# Patient Record
Sex: Male | Born: 1970 | Race: White | Hispanic: No | Smoking: Never smoker
Health system: Southern US, Community
[De-identification: ages and names within clinical notes are randomized; demographics above are authoritative.]

## PROBLEM LIST (undated history)

## (undated) DIAGNOSIS — B0229 Other postherpetic nervous system involvement: Secondary | ICD-10-CM

---

## 2016-06-23 ENCOUNTER — Emergency Department (HOSPITAL_BASED_OUTPATIENT_CLINIC_OR_DEPARTMENT_OTHER): Payer: Self-pay

## 2016-06-23 ENCOUNTER — Encounter (HOSPITAL_BASED_OUTPATIENT_CLINIC_OR_DEPARTMENT_OTHER): Payer: Self-pay | Admitting: *Deleted

## 2016-06-23 ENCOUNTER — Emergency Department (HOSPITAL_BASED_OUTPATIENT_CLINIC_OR_DEPARTMENT_OTHER)
Admission: EM | Admit: 2016-06-23 | Discharge: 2016-06-24 | Disposition: A | Discharge status: Home / Self Care | Payer: Self-pay | Attending: Emergency Medicine | Admitting: Emergency Medicine

## 2016-06-23 DIAGNOSIS — R229 Localized swelling, mass and lump, unspecified: Secondary | ICD-10-CM

## 2016-06-23 DIAGNOSIS — IMO0002 Reserved for concepts with insufficient information to code with codable children: Secondary | ICD-10-CM

## 2016-06-23 DIAGNOSIS — N492 Inflammatory disorders of scrotum: Secondary | ICD-10-CM | POA: Insufficient documentation

## 2016-06-23 DIAGNOSIS — R079 Chest pain, unspecified: Secondary | ICD-10-CM

## 2016-06-23 DIAGNOSIS — R072 Precordial pain: Secondary | ICD-10-CM | POA: Insufficient documentation

## 2016-06-23 DIAGNOSIS — R0602 Shortness of breath: Secondary | ICD-10-CM | POA: Insufficient documentation

## 2016-06-23 LAB — URINALYSIS, ROUTINE W REFLEX MICROSCOPIC
BILIRUBIN URINE: NEGATIVE
Glucose, UA: NEGATIVE mg/dL
HGB URINE DIPSTICK: NEGATIVE
Ketones, ur: NEGATIVE mg/dL
Leukocytes, UA: NEGATIVE
NITRITE: NEGATIVE
PROTEIN: NEGATIVE mg/dL
Specific Gravity, Urine: 1.024 (ref 1.005–1.030)
pH: 6.5 (ref 5.0–8.0)

## 2016-06-23 LAB — COMPREHENSIVE METABOLIC PANEL
ALT: 15 U/L — ABNORMAL LOW (ref 17–63)
AST: 20 U/L (ref 15–41)
Albumin: 3.9 g/dL (ref 3.5–5.0)
Alkaline Phosphatase: 52 U/L (ref 38–126)
Anion gap: 9 (ref 5–15)
BILIRUBIN TOTAL: 0.3 mg/dL (ref 0.3–1.2)
BUN: 16 mg/dL (ref 6–20)
CO2: 24 mmol/L (ref 22–32)
CREATININE: 1.06 mg/dL (ref 0.61–1.24)
Calcium: 9.3 mg/dL (ref 8.9–10.3)
Chloride: 105 mmol/L (ref 101–111)
Glucose, Bld: 112 mg/dL — ABNORMAL HIGH (ref 65–99)
POTASSIUM: 3.8 mmol/L (ref 3.5–5.1)
Sodium: 138 mmol/L (ref 135–145)
TOTAL PROTEIN: 7 g/dL (ref 6.5–8.1)

## 2016-06-23 LAB — CBC WITH DIFFERENTIAL/PLATELET
Basophils Absolute: 0 10*3/uL (ref 0.0–0.1)
Basophils Relative: 0 %
EOS ABS: 0.1 10*3/uL (ref 0.0–0.7)
EOS PCT: 2 %
HCT: 41.9 % (ref 39.0–52.0)
Hemoglobin: 14.2 g/dL (ref 13.0–17.0)
LYMPHS ABS: 2 10*3/uL (ref 0.7–4.0)
Lymphocytes Relative: 22 %
MCH: 31.8 pg (ref 26.0–34.0)
MCHC: 33.9 g/dL (ref 30.0–36.0)
MCV: 93.9 fL (ref 78.0–100.0)
MONOS PCT: 10 %
Monocytes Absolute: 0.9 10*3/uL (ref 0.1–1.0)
Neutro Abs: 6.3 10*3/uL (ref 1.7–7.7)
Neutrophils Relative %: 66 %
PLATELETS: 235 10*3/uL (ref 150–400)
RBC: 4.46 MIL/uL (ref 4.22–5.81)
RDW: 14.1 % (ref 11.5–15.5)
WBC: 9.4 10*3/uL (ref 4.0–10.5)

## 2016-06-23 LAB — TROPONIN I: Troponin I: <0.03 ng/mL (ref <0.03)

## 2016-06-23 MED ORDER — DOXYCYCLINE HYCLATE 100 MG PO CAPS: 100.0000 mg | ORAL_CAPSULE | Freq: Two times a day (BID) | ORAL | 0 refills | Status: DC

## 2016-06-23 MED ORDER — DOXYCYCLINE HYCLATE 100 MG PO TABS
100.0000 mg | ORAL_TABLET | Freq: Once | ORAL | Status: AC
  Administered 2016-06-23: 100 mg via ORAL
  Filled 2016-06-23: qty 1

## 2016-06-23 MED ORDER — GI COCKTAIL ~~LOC~~
30.0000 mL | Freq: Once | ORAL | Status: AC
  Administered 2016-06-23: 30 mL via ORAL
  Filled 2016-06-23: qty 30

## 2016-06-23 NOTE — ED Notes (Signed)
Pt is now complaining of scrotum pain

## 2016-06-23 NOTE — Discharge Instructions (Signed)
1. IT IS IMPORTANT TO TAKE ANTIBIOTICS AS DIRECTED AND SEE A UROLOGIST IN 1-2 WEEKS, OR SOONER IF SYMPTOMS OF SCROTAL SWELLING/PAIN WORSEN.  2. SEEK IMMEDIATE MEDICAL ATTENTION IF ANY WORSENING CHEST PAIN/SHORTNESS OF BREATH. FOLLOW UP WITH A PRIMARY CARE PROVIDER AS SOON AS POSSIBLE FOR BLOOD PRESSURE MANAGEMENT.

## 2016-06-23 NOTE — ED Notes (Signed)
Pt and corrections officers verbalizes understanding of dc instructions and deny any further needs at this time.

## 2016-06-23 NOTE — ED Notes (Signed)
Pt states having mid sternal cp x 2-3 hours radiating to back  Denies other complaints  Pt has 2 correctional officers with him   Is being [place]  Started having pain en route

## 2016-06-23 NOTE — ED Provider Notes (Signed)
[facility] Note   CSN: [id number] Arrival date & time: 06/23/16  2005  By signing my name below, I, Sonum Patel, attest that this documentation has been prepared under the direction and in the presence of Laurence Spates, MD. Electronically Signed: Sonum Patel, Neurosurgeon. 06/23/16. 9:42 PM.  History   Chief Complaint Chief Complaint  Patient presents with  . Chest Pain   The history is provided by the patient. No language interpreter was used.    HPI Comments: Cody Parks is a 46 y.o. male who presents to the Emergency Department complaining of gradual onset, constant, gradually worsened substernal CP and mid back pain between the shoulder blades that started today. He reports associated SOB, abdominal pain, and right scrotal swelling and pain. He states the last time he had scrotal swelling was 1 week ago. He describes his abdominal pain feeling as though he was kicked in the groin. He denies any trauma to the abdomen or groin. Per chart review, he was seen on 4/5 for right scrotal swelling and pain and was diagnosed with epididymitis and was treated with 9 days of antibiotics and instructed to follow up with urology. He had surgery earlier this year for a scrotal abscess. He denies history of similar mid back pain in the past. He denies dysuria, diarrhea, constipation, fever. He denies history of chronic medical conditions. He denies history or CA or blood clots. He denies recent surgeries or hospitalizations. He denies any cocaine or tobacco use. He denies history of HTN or DM.  Of note, pt developed chest pain while being transported from one prison facility to another and was brought here during transport.  History reviewed. No pertinent past medical history.  There are no active problems to display for this patient.   History reviewed. No pertinent surgical history.     Home Medications    Prior to Admission medications   Medication Sig Start Date End  Date Taking? Authorizing Provider  doxycycline (VIBRAMYCIN) 100 MG capsule Take 1 capsule (100 mg total) by mouth 2 (two) times daily. 06/23/16   Laurence Spates, MD    Family History No family history on file.  Social History Social History  Substance Use Topics  . Smoking status: Never Smoker  . Smokeless tobacco: Never Used  . Alcohol use Yes     Allergies   Shellfish allergy   Review of Systems Review of Systems  Constitutional: Negative for fever.  Respiratory: Positive for shortness of breath.   Gastrointestinal: Positive for abdominal pain. Negative for constipation and diarrhea.  Genitourinary: Positive for scrotal swelling and testicular pain. Negative for dysuria.  Musculoskeletal: Positive for back pain.  All other systems reviewed and are negative.    Physical Exam Updated Vital Signs BP (!) 164/115   Pulse (!) 57   Temp 98.4 F (36.9 C) (Oral)   Resp 14   Ht  (1.753 m)   Wt 200 lb (90.7 kg)   SpO2 100%   BMI 29.53 kg/m   Physical Exam  Constitutional: He is oriented to person, place, and time. He appears well-developed and well-nourished. No distress.  HENT:  Head: Normocephalic and atraumatic.  Moist mucous membranes  Eyes: Conjunctivae are normal. Pupils are equal, round, and reactive to light.  Neck: Neck supple.  Cardiovascular: Normal rate, regular rhythm and normal heart sounds.   No murmur heard. Right arm: 160/108  Left arm: 160/106  Pulmonary/Chest: Effort normal and breath sounds normal. He exhibits tenderness (central  lower chest at xiphoid process).  Abdominal: Soft. Bowel sounds are normal. He exhibits no distension. There is no tenderness.  Genitourinary: Penis normal.  Genitourinary Comments: Right testicle larger than left with generalized tenderness with no skin changes in scrotum.   Musculoskeletal: He exhibits tenderness. He exhibits no edema.  Tenderness of mid thoracic back without step off or skin changes     Neurological: He is alert and oriented to person, place, and time.  Fluent speech  Skin: Skin is warm and dry.  Psychiatric:  Mildly anxious. Bizarre affect and avoids eye contact   Nursing note and vitals reviewed.    ED Treatments / Results  DIAGNOSTIC STUDIES: Oxygen Saturation is 96% on RA, adequate by my interpretation.    COORDINATION OF CARE: 9:24 PM Discussed treatment plan with pt at bedside and pt agreed to plan.   Labs (all labs ordered are listed, but only abnormal results are displayed) Labs Reviewed  COMPREHENSIVE METABOLIC PANEL - Abnormal; Notable for the following:       Result Value   Glucose, Bld 112 (*)    ALT 15 (*)    All other components within normal limits  URINE CULTURE  CBC WITH DIFFERENTIAL/PLATELET  TROPONIN I  URINALYSIS, ROUTINE W REFLEX MICROSCOPIC    EKG  EKG Interpretation  Date/Time:  Friday June 23 2016 20:08:44 EDT Ventricular Rate:  78 PR Interval:    QRS Duration: 146 QT Interval:  382 QTC Calculation: 436 R Axis:   76 Text Interpretation:  Sinus rhythm Nonspecific intraventricular conduction delay Baseline wander in lead(s) V1 needs repeat poor quality Confirmed by Dawana Asper MD, Amillion Macchia [phone]) on 06/23/2016 9:24:36 PM       Radiology US Scrotum  Result Date: 06/23/2016 CLINICAL DATA:  Acute onset of right testicular pain and swelling. Initial encounter. EXAM: SCROTAL ULTRASOUND DOPPLER ULTRASOUND OF THE TESTICLES TECHNIQUE: Complete ultrasound examination of the testicles, epididymis, and other scrotal structures was performed. Color and spectral Doppler ultrasound were also utilized to evaluate blood flow to the testicles. COMPARISON:  None. FINDINGS: Right testicle Measurements: 3.5 x 2.5 x 3.2 cm. No mass or microlithiasis visualized. Left testicle Measurements: 3.6 x 1.9 x 2.6 cm. No mass or microlithiasis visualized. Right epididymis:  Not definitely seen. Left epididymis: A tiny 0.3 cm cyst is noted at the left epididymal  head. Hydrocele:  None visualized. Varicocele:  None visualized. Pulsed Doppler interrogation of both testes demonstrates normal low resistance arterial and venous waveforms bilaterally. Diffuse skin thickening is noted at the right side of the scrotum, with slightly increased blood flow at the scrotal wall, concerning for hyperemia. IMPRESSION: 1. Diffuse skin thickening at the right side of the scrotum, with associated hyperemia, concerning for cellulitis. 2. No evidence of testicular torsion. 3. Tiny 0.3 cm left epididymal head cyst incidentally noted. Electronically Signed   By: Roanna Raider M.D.   On: 06/23/2016 22:24   [place] Art/ven Flow Abd Pelv Doppler  Result Date: 06/23/2016 CLINICAL DATA:  Acute onset of right testicular pain and swelling. Initial encounter. EXAM: SCROTAL ULTRASOUND DOPPLER ULTRASOUND OF THE TESTICLES TECHNIQUE: Complete ultrasound examination of the testicles, epididymis, and other scrotal structures was performed. Color and spectral Doppler ultrasound were also utilized to evaluate blood flow to the testicles. COMPARISON:  None. FINDINGS: Right testicle Measurements: 3.5 x 2.5 x 3.2 cm. No mass or microlithiasis visualized. Left testicle Measurements: 3.6 x 1.9 x 2.6 cm. No mass or microlithiasis visualized. Right epididymis:  Not definitely seen. Left epididymis: A tiny  0.3 cm cyst is noted at the left epididymal head. Hydrocele:  None visualized. Varicocele:  None visualized. Pulsed Doppler interrogation of both testes demonstrates normal low resistance arterial and venous waveforms bilaterally. Diffuse skin thickening is noted at the right side of the scrotum, with slightly increased blood flow at the scrotal wall, concerning for hyperemia. IMPRESSION: 1. Diffuse skin thickening at the right side of the scrotum, with associated hyperemia, concerning for cellulitis. 2. No evidence of testicular torsion. 3. Tiny 0.3 cm left epididymal head cyst incidentally noted. Electronically  Signed   By: Roanna Raider M.D.   On: 06/23/2016 22:24   Dg Chest Port 1 View  Result Date: 06/23/2016 CLINICAL DATA:  Mid sternal chest pain radiating to the back EXAM: PORTABLE CHEST 1 VIEW COMPARISON:  None. FINDINGS: The heart size and mediastinal contours are within normal limits. Both lungs are clear. The visualized skeletal structures are unremarkable. IMPRESSION: No active disease. Electronically Signed   By: Jasmine Pang M.D.   On: 06/23/2016 20:52    Procedures Procedures (including critical care time)  Medications Ordered in ED Medications  gi cocktail (Maalox,Lidocaine,Donnatal) (30 mLs Oral Given 06/23/16 2037)  doxycycline (VIBRA-TABS) tablet 100 mg (100 mg Oral Given 06/23/16 2339)     Initial Impression / Assessment and Plan / ED Course  I have reviewed the triage vital signs and the nursing notes.  Pertinent labs & imaging results that were available during my care of the patient were reviewed by me and considered in my medical decision making (see chart for details).     I personally performed the services described in this documentation, which was scribed in my presence. The recorded information has been reviewed and is accurate.  Pt p/w chest pain that was gradual in onset, associated upper back pain and SOB. He was Nontoxic on exam, vital signs notable for hypertension. Patient is not currently on any antihypertensive medications. EKG without acute ischemic changes. He did have point tenderness at his xiphoid process and mid thoracic back which he states reproduced his respective chest and back pain. This makes aortic dissection or ACS less likely. Upper extremity blood pressures equal, chest x-ray negative acute, and troponin negative. Other labs reassuring. He should incidentally noted his scrotal pain and swelling therefore obtained ultrasound which is suggestive of cellulitis but no fluid collection. I discussed with urologist on call, Dr. Butch Penny, appreciate  assistance. He recommended 2 weeks of doxycycline which I have provided and I have emphasized the importance of following up with urology within 1 week. Regarding his chest pain, the patient has no risk factors for PE given short journey and HEART score is <3; furthermore, sx are very atypical for ACS based on exam. I have discussed supportive care and reviewed return precautions. Patient discharged in satisfactory condition.  Final Clinical Impressions(s) / ED Diagnoses   Final diagnoses:  Mass  Chest pain, unspecified type  Cellulitis of scrotum    New Prescriptions Discharge Medication List as of 06/23/2016 11:57 PM    START taking these medications   Details  doxycycline (VIBRAMYCIN) 100 MG capsule Take 1 capsule (100 mg total) by mouth 2 (two) times daily., Starting Fri 06/23/2016, Print       I personally performed the services described in this documentation, which was scribed in my presence. The recorded information has been reviewed and is accurate.    Laurence Spates, MD 06/24/16 [phone]

## 2016-06-23 NOTE — ED Triage Notes (Signed)
He was being transported from [employer] to another facility with 2 police offices when pt c.o pain in the center of his chest after eating boiled eggs. Feels like indigestion.

## 2016-06-25 LAB — URINE CULTURE: Culture: NO GROWTH

## 2016-08-04 ENCOUNTER — Emergency Department (HOSPITAL_COMMUNITY)

## 2016-08-04 ENCOUNTER — Emergency Department
Admission: EM | Admit: 2016-08-04 | Discharge: 2016-08-04 | Attending: Emergency Medicine | Admitting: Emergency Medicine

## 2016-08-04 ENCOUNTER — Inpatient Hospital Stay (HOSPITAL_COMMUNITY)
Admission: EM | Admit: 2016-08-04 | Discharge: 2016-08-08 | DRG: 880 | Attending: Student in an Organized Health Care Education/Training Program | Admitting: Student in an Organized Health Care Education/Training Program

## 2016-08-04 ENCOUNTER — Encounter (HOSPITAL_COMMUNITY): Payer: Self-pay | Admitting: *Deleted

## 2016-08-04 ENCOUNTER — Emergency Department

## 2016-08-04 ENCOUNTER — Encounter: Payer: Self-pay | Admitting: Emergency Medicine

## 2016-08-04 DIAGNOSIS — I10 Essential (primary) hypertension: Secondary | ICD-10-CM | POA: Diagnosis not present

## 2016-08-04 DIAGNOSIS — R471 Dysarthria and anarthria: Secondary | ICD-10-CM | POA: Diagnosis present

## 2016-08-04 DIAGNOSIS — E669 Obesity, unspecified: Secondary | ICD-10-CM | POA: Diagnosis not present

## 2016-08-04 DIAGNOSIS — R29810 Facial weakness: Secondary | ICD-10-CM | POA: Diagnosis present

## 2016-08-04 DIAGNOSIS — R112 Nausea with vomiting, unspecified: Secondary | ICD-10-CM | POA: Diagnosis not present

## 2016-08-04 DIAGNOSIS — B0229 Other postherpetic nervous system involvement: Secondary | ICD-10-CM | POA: Diagnosis present

## 2016-08-04 DIAGNOSIS — Z79899 Other long term (current) drug therapy: Secondary | ICD-10-CM | POA: Diagnosis not present

## 2016-08-04 DIAGNOSIS — M5481 Occipital neuralgia: Secondary | ICD-10-CM | POA: Diagnosis present

## 2016-08-04 DIAGNOSIS — F449 Dissociative and conversion disorder, unspecified: Secondary | ICD-10-CM | POA: Diagnosis present

## 2016-08-04 DIAGNOSIS — R1084 Generalized abdominal pain: Secondary | ICD-10-CM | POA: Diagnosis not present

## 2016-08-04 DIAGNOSIS — Z91013 Allergy to seafood: Secondary | ICD-10-CM

## 2016-08-04 DIAGNOSIS — E785 Hyperlipidemia, unspecified: Secondary | ICD-10-CM | POA: Diagnosis not present

## 2016-08-04 DIAGNOSIS — Z6829 Body mass index (BMI) 29.0-29.9, adult: Secondary | ICD-10-CM | POA: Diagnosis not present

## 2016-08-04 DIAGNOSIS — Z8619 Personal history of other infectious and parasitic diseases: Secondary | ICD-10-CM | POA: Diagnosis not present

## 2016-08-04 DIAGNOSIS — I639 Cerebral infarction, unspecified: Secondary | ICD-10-CM

## 2016-08-04 DIAGNOSIS — H5712 Ocular pain, left eye: Secondary | ICD-10-CM | POA: Diagnosis not present

## 2016-08-04 DIAGNOSIS — Z9282 Status post administration of tPA (rtPA) in a different facility within the last 24 hours prior to admission to current facility: Secondary | ICD-10-CM | POA: Diagnosis not present

## 2016-08-04 DIAGNOSIS — R109 Unspecified abdominal pain: Secondary | ICD-10-CM | POA: Diagnosis not present

## 2016-08-04 DIAGNOSIS — G8194 Hemiplegia, unspecified affecting left nondominant side: Secondary | ICD-10-CM | POA: Diagnosis not present

## 2016-08-04 DIAGNOSIS — R2990 Unspecified symptoms and signs involving the nervous system: Secondary | ICD-10-CM | POA: Diagnosis not present

## 2016-08-04 DIAGNOSIS — R4781 Slurred speech: Secondary | ICD-10-CM | POA: Diagnosis present

## 2016-08-04 DIAGNOSIS — Z91041 Radiographic dye allergy status: Secondary | ICD-10-CM | POA: Diagnosis not present

## 2016-08-04 DIAGNOSIS — Z888 Allergy status to other drugs, medicaments and biological substances status: Secondary | ICD-10-CM

## 2016-08-04 HISTORY — DX: Other postherpetic nervous system involvement: B02.29

## 2016-08-04 LAB — DIFFERENTIAL
BASOS ABS: 0.1 10*3/uL (ref 0–0.1)
BASOS PCT: 1 %
EOS ABS: 0.2 10*3/uL (ref 0–0.7)
Eosinophils Relative: 2 %
Lymphocytes Relative: 18 %
Lymphs Abs: 1.6 10*3/uL (ref 1.0–3.6)
Monocytes Absolute: 0.6 10*3/uL (ref 0.2–1.0)
Monocytes Relative: 7 %
NEUTROS PCT: 72 %
Neutro Abs: 6.2 10*3/uL (ref 1.4–6.5)

## 2016-08-04 LAB — APTT: APTT: 27 s (ref 24–36)

## 2016-08-04 LAB — PROTIME-INR
INR: 0.93
Prothrombin Time: 12.5 seconds (ref 11.4–15.2)

## 2016-08-04 LAB — COMPREHENSIVE METABOLIC PANEL
ALBUMIN: 4.5 g/dL (ref 3.5–5.0)
ALT: 17 U/L (ref 17–63)
AST: 24 U/L (ref 15–41)
Alkaline Phosphatase: 54 U/L (ref 38–126)
Anion gap: 7 (ref 5–15)
BUN: 12 mg/dL (ref 6–20)
CHLORIDE: 106 mmol/L (ref 101–111)
CO2: 26 mmol/L (ref 22–32)
Calcium: 10.1 mg/dL (ref 8.9–10.3)
Creatinine, Ser: 0.99 mg/dL (ref 0.61–1.24)
GFR calc Af Amer: >60 mL/min (ref >60)
GFR calc non Af Amer: >60 mL/min (ref >60)
Glucose, Bld: 90 mg/dL (ref 65–99)
POTASSIUM: 4.6 mmol/L (ref 3.5–5.1)
SODIUM: 139 mmol/L (ref 135–145)
Total Bilirubin: 0.6 mg/dL (ref 0.3–1.2)
Total Protein: 7.5 g/dL (ref 6.5–8.1)

## 2016-08-04 LAB — CBC
HCT: 41.6 % (ref 40.0–52.0)
Hemoglobin: 14.1 g/dL (ref 13.0–18.0)
MCH: 31.6 pg (ref 26.0–34.0)
MCHC: 33.9 g/dL (ref 32.0–36.0)
MCV: 93.3 fL (ref 80.0–100.0)
PLATELETS: 189 10*3/uL (ref 150–440)
RBC: 4.45 MIL/uL (ref 4.40–5.90)
RDW: 14.6 % — AB (ref 11.5–14.5)
WBC: 8.6 10*3/uL (ref 3.8–10.6)

## 2016-08-04 LAB — TROPONIN I

## 2016-08-04 LAB — GLUCOSE, CAPILLARY: Glucose-Capillary: 86 mg/dL (ref 65–99)

## 2016-08-04 MED ORDER — ACETAMINOPHEN 650 MG RE SUPP: 650.0000 mg | RECTAL | Status: DC | PRN

## 2016-08-04 MED ORDER — SODIUM CHLORIDE 0.9% FLUSH
3.0000 mL | Freq: Two times a day (BID) | INTRAVENOUS | Status: DC
  Administered 2016-08-05 – 2016-08-07 (×5): 3 mL via INTRAVENOUS

## 2016-08-04 MED ORDER — HYDRALAZINE HCL 20 MG/ML IJ SOLN: 5.0000 mg | INTRAMUSCULAR | Status: AC | PRN

## 2016-08-04 MED ORDER — SENNOSIDES-DOCUSATE SODIUM 8.6-50 MG PO TABS
1.0000 | ORAL_TABLET | Freq: Every evening | ORAL | Status: DC | PRN
  Administered 2016-08-06: 1 via ORAL
  Filled 2016-08-04: qty 1

## 2016-08-04 MED ORDER — KETOROLAC TROMETHAMINE 30 MG/ML IJ SOLN
30.0000 mg | Freq: Four times a day (QID) | INTRAMUSCULAR | Status: DC | PRN
  Administered 2016-08-04 – 2016-08-06 (×5): 30 mg via INTRAVENOUS
  Filled 2016-08-04 (×5): qty 1

## 2016-08-04 MED ORDER — ENOXAPARIN SODIUM 40 MG/0.4ML ~~LOC~~ SOLN
40.0000 mg | SUBCUTANEOUS | Status: DC
  Administered 2016-08-05 – 2016-08-06 (×2): 40 mg via SUBCUTANEOUS
  Filled 2016-08-04 (×2): qty 0.4

## 2016-08-04 MED ORDER — PANTOPRAZOLE SODIUM 40 MG IV SOLR
40.0000 mg | Freq: Every day | INTRAVENOUS | Status: DC
  Administered 2016-08-04: 40 mg via INTRAVENOUS
  Filled 2016-08-04: qty 40

## 2016-08-04 MED ORDER — HYDROCORTISONE NA SUCCINATE PF 250 MG IJ SOLR: 200.0000 mg | Freq: Once | INTRAMUSCULAR | Status: DC

## 2016-08-04 MED ORDER — ACETAMINOPHEN 160 MG/5ML PO SOLN: 650.0000 mg | ORAL | Status: DC | PRN

## 2016-08-04 MED ORDER — HYDROCORTISONE NA SUCCINATE PF 100 MG IJ SOLR
INTRAMUSCULAR | Status: AC
  Filled 2016-08-04: qty 4

## 2016-08-04 MED ORDER — GABAPENTIN 300 MG PO CAPS
300.0000 mg | ORAL_CAPSULE | Freq: Three times a day (TID) | ORAL | Status: DC
  Administered 2016-08-05 – 2016-08-07 (×7): 300 mg via ORAL
  Filled 2016-08-04 (×7): qty 1

## 2016-08-04 MED ORDER — SODIUM CHLORIDE 0.9 % IV SOLN: 50.0000 mL | Freq: Once | INTRAVENOUS | Status: DC

## 2016-08-04 MED ORDER — AMLODIPINE BESYLATE 10 MG PO TABS
10.0000 mg | ORAL_TABLET | Freq: Every day | ORAL | Status: DC
  Administered 2016-08-05 – 2016-08-08 (×4): 10 mg via ORAL
  Filled 2016-08-04 (×4): qty 1

## 2016-08-04 MED ORDER — SODIUM CHLORIDE 0.9 % IV SOLN
INTRAVENOUS | Status: DC
  Administered 2016-08-04: 23:00:00 via INTRAVENOUS

## 2016-08-04 MED ORDER — LABETALOL HCL 5 MG/ML IV SOLN
20.0000 mg | Freq: Once | INTRAVENOUS | Status: AC
  Administered 2016-08-04: 20 mg via INTRAVENOUS

## 2016-08-04 MED ORDER — STROKE: EARLY STAGES OF RECOVERY BOOK
Freq: Once | Status: AC
  Administered 2016-08-04: 1
  Filled 2016-08-04: qty 1

## 2016-08-04 MED ORDER — CLEVIDIPINE BUTYRATE 0.5 MG/ML IV EMUL: 0.0000 mg/h | INTRAVENOUS | Status: DC

## 2016-08-04 MED ORDER — ALTEPLASE (STROKE) FULL DOSE INFUSION
0.9000 mg/kg | Freq: Once | INTRAVENOUS | Status: AC
  Administered 2016-08-04: 80 mg via INTRAVENOUS
  Filled 2016-08-04: qty 100

## 2016-08-04 MED ORDER — ACETAMINOPHEN 325 MG PO TABS
650.0000 mg | ORAL_TABLET | ORAL | Status: DC | PRN
  Administered 2016-08-05: 650 mg via ORAL
  Filled 2016-08-04: qty 2

## 2016-08-04 MED ORDER — STROKE: EARLY STAGES OF RECOVERY BOOK
Freq: Once | Status: DC
  Filled 2016-08-04: qty 1

## 2016-08-04 MED ORDER — NICARDIPINE HCL IN NACL 20-0.86 MG/200ML-% IV SOLN: 0.0000 mg/h | INTRAVENOUS | Status: DC

## 2016-08-04 MED ORDER — ALTEPLASE 100 MG IV SOLR
INTRAVENOUS | Status: AC
  Filled 2016-08-04: qty 100

## 2016-08-04 MED ORDER — LABETALOL HCL 5 MG/ML IV SOLN: 20.0000 mg | Freq: Once | INTRAVENOUS | Status: DC

## 2016-08-04 MED ORDER — DIPHENHYDRAMINE HCL 50 MG/ML IJ SOLN
INTRAMUSCULAR | Status: AC
  Filled 2016-08-04: qty 1

## 2016-08-04 MED ORDER — DIPHENHYDRAMINE HCL 50 MG/ML IJ SOLN
50.0000 mg | Freq: Once | INTRAMUSCULAR | Status: AC
  Administered 2016-08-04: 50 mg via INTRAVENOUS

## 2016-08-04 MED ORDER — NICARDIPINE HCL IN NACL 20-0.86 MG/200ML-% IV SOLN
0.0000 mg/h | INTRAVENOUS | Status: DC
  Administered 2016-08-04: 5 mg/h via INTRAVENOUS

## 2016-08-04 NOTE — Consult Note (Signed)
Referring Physician: Dr. Vanita Panda    Chief Complaint: Left sided numbness, weakness and headache  HPI: Cody Parks is an 46 y.o. male who presented to [facility] with acute onset of left sided weakness and numbness, in conjunction with left eye pain which was exacerbated by looking towards the left. Also with left facial droop and dysarthria, as well as "difficulty seeing to the left". Initial NIHSS at [facility] was 10. He received IV tPA at [facility] and was emergently transported to [facility] for possible thrombectomy. He has an iodine allergy and therefore was premedicated prior to transfer with 200 mg IV hydrocortisone and 50 mg IV Benadryl.   On arrival, he continued to endorse left sided weakness, left eye pain and left sided visual blurring. IV access was lost en route and several unsuccessful attempts to place new IV for CTA were made. In order to expedite assessment, decision was made with Dr. Estanislado Pandy to obtain an MRI and MRA of the head to assess for core infarct size relative to clinical deficit and also to assess for possible LVO.   The patient has a recent history of shingles involving the left side of his face in the infraorbital region.    LSN: 12:50 PM tPA Given: Yes, at [facility]  History reviewed. No pertinent past medical history.  History reviewed. No pertinent surgical history.  No family history on file. Social History:  reports that he has never smoked. He has never used smokeless tobacco. He reports that he drinks alcohol. His drug history is not on file.  Allergies:  Allergies  Allergen Reactions  . Iodine Swelling    IV contrast dye  . Shellfish Allergy Anaphylaxis  . Propranolol Rash    Medications:  acetaminophen (TYLENOL) 325 MG tablet Take 650 mg by mouth every 6 (six) hours as needed for headache (pain). [provider] Needs Review  amLODipine (NORVASC) 10 MG tablet Take 10 mg by mouth daily. [provider] Needs Review  calcium carbonate (TUMS - DOSED IN MG  ELEMENTAL CALCIUM) 500 MG chewable tablet Chew 2 tablets by mouth 2 (two) times daily as needed for heartburn. [provider] Needs Review  gabapentin (NEURONTIN) 300 MG capsule Take 300 mg by mouth 3 (three) times daily.  [provider] Needs Review  ibuprofen (ADVIL,MOTRIN) 200 MG tablet Take 400 mg by mouth every 6 (six) hours as needed for headache (pain). [provider] Needs Review  OVER THE COUNTER MEDICATION Place 1 drop into both eyes daily as needed (dry eyes). Over the counter lubricating eye drops [provider] Needs Review  pantoprazole (PROTONIX) 40 MG tablet Take 40 mg by mouth daily. [provider] Needs Review  senna (SENOKOT) 8.6 MG tablet Take 2 tablets by mouth See admin instructions. Take 2 tablets by mouth daily at bedtime for 21 days (start date 07/19/16), then stop [provider] Needs Review  levofloxacin (LEVAQUIN) 500 MG tablet Take 500 mg by mouth See admin instructions. Take 1 tablet (500 mg) by mouth daily for 14 days (start date 06/16/16) [provider] Needs Review  valACYclovir (VALTREX) 1000 MG tablet Take 1,000 mg by mouth See admin instructions. #21 filled 07/08/16 - take 1 tablet (1000 mg) by mouth every 8 hours for 7 days [provider] Needs Review    ROS: Left eye pain and left sided weakness.    Physical Examination: Blood pressure (!) 141/106, pulse 88, resp. rate 18, height 5' 9"  (1.753 m), weight 95 kg (209 lb 7 oz),  SpO2 98 %.  HEENT: Small focus of rash with eschar left side of face infraorbitally.  Lungs: Respirations unlabored Ext: Warm and well perfused  Neurologic Examination: Ment: Anxious affect. Speaks fluently with hushed voice. Able to follow all commands.  CN: PERRL. EOMI without nystagmus. Right visual field normal with subjective blurring of left visual field. Face symmetric except for smaller palpebral fissure on the left with apparent squinting of left eye.  Speech dysarthric and hypophonic, but with an atypical pattern suggestive of possible embellishment. Did not protrude tongue to command.  Motor: Exhibits vacillating strength in all 4 extremities, overall with weaker responses of LUE and LLE than on the right. Significant giveway weakness noted bilaterally. Maximum elicited strength of LUE and LLE is 4/5. Maximum elicited strength of RUE and RLE is 5/5. Tone is normal x 4.  Sensory: Subjectively decreased FT sensation to LUE and LLE.  Reflexes: Normoactive x 4.  Cerebellar: Does not comply with cerebellar examination.  Gait: Deferred.   Results for orders placed or performed during the hospital encounter of 08/04/16 (from the past 48 hour(s))  Glucose, capillary     Status: None   Collection Time: 08/04/16  1:38 PM  Result Value Ref Range   Glucose-Capillary 86 65 - 99 mg/dL  Protime-INR     Status: None   Collection Time: 08/04/16  2:06 PM  Result Value Ref Range   Prothrombin Time 12.5 11.4 - 15.2 seconds   INR 0.93   APTT     Status: None   Collection Time: 08/04/16  2:06 PM  Result Value Ref Range   aPTT 27 24 - 36 seconds  CBC     Status: Abnormal   Collection Time: 08/04/16  2:06 PM  Result Value Ref Range   WBC 8.6 3.8 - 10.6 K/uL   RBC 4.45 4.40 - 5.90 MIL/uL   Hemoglobin 14.1 13.0 - 18.0 g/dL   HCT 41.6 40.0 - 52.0 %   MCV 93.3 80.0 - 100.0 fL   MCH 31.6 26.0 - 34.0 pg   MCHC 33.9 32.0 - 36.0 g/dL   RDW 14.6 (H) 11.5 - 14.5 %   Platelets 189 150 - 440 K/uL  Differential     Status: None   Collection Time: 08/04/16  2:06 PM  Result Value Ref Range   Neutrophils Relative % 72 %   Neutro Abs 6.2 1.4 - 6.5 K/uL   Lymphocytes Relative 18 %   Lymphs Abs 1.6 1.0 - 3.6 K/uL   Monocytes Relative 7 %   Monocytes Absolute 0.6 0.2 - 1.0 K/uL   Eosinophils Relative 2 %   Eosinophils Absolute 0.2 0 - 0.7 K/uL   Basophils Relative 1 %   Basophils Absolute 0.1 0 - 0.1 K/uL  Comprehensive metabolic panel     Status: None    Collection Time: 08/04/16  2:06 PM  Result Value Ref Range   Sodium 139 135 - 145 mmol/L   Potassium 4.6 3.5 - 5.1 mmol/L   Chloride 106 101 - 111 mmol/L   CO2 26 22 - 32 mmol/L   Glucose, Bld 90 65 - 99 mg/dL   BUN 12 6 - 20 mg/dL   Creatinine, Ser 0.99 0.61 - 1.24 mg/dL   Calcium 10.1 8.9 - 10.3 mg/dL   Total Protein 7.5 6.5 - 8.1 g/dL   Albumin 4.5 3.5 - 5.0 g/dL   AST 24 15 - 41 U/L   ALT 17 17 - 63 U/L   Alkaline Phosphatase  54 38 - 126 U/L   Total Bilirubin 0.6 0.3 - 1.2 mg/dL   GFR calc non Af Amer >60 >60 mL/min   GFR calc Af Amer >60 >60 mL/min    Comment: (NOTE) The eGFR has been calculated using the CKD EPI equation. This calculation has not been validated in all clinical situations. eGFR's persistently <60 mL/min signify possible Chronic Kidney Disease.    Anion gap 7 5 - 15  Troponin I     Status: None   Collection Time: 08/04/16  2:06 PM  Result Value Ref Range   Troponin I <0.03 <0.03 ng/mL   Mr Virgel Paling QJ Contrast  Result Date: 08/04/2016 CLINICAL DATA:  46 year old male code stroke. Slurred speech and left weakness with facial droop. Last seen normal 1250 hours. EXAM: LIMITED MRI HEAD WITHOUT CONTRAST MRA HEAD WITHOUT CONTRAST TECHNIQUE: MRI imaging the brain and surrounding structures were obtained without intravenous contrast. Angiographic images of the head were obtained using MRA technique without contrast. COMPARISON:  CT head without contrast 1331 hours today. FINDINGS: MRI HEAD FINDINGS Diffusion-weighted imaging and axial FLAIR imaging only was performed. Susceptibility artifact from an unknown into the causes signal loss at the left posterior fossa. This affects the inferior left cerebellar visualization on diffusion imaging. No restricted diffusion to suggest acute infarction. No midline shift, mass effect, evidence of mass lesion, ventriculomegaly, extra-axial collection or acute intracranial hemorrhage. FLAIR signal is within normal limits. MRA HEAD  FINDINGS Antegrade flow in the distal right vertebral artery, but seemingly absent antegrade flow signal in the distal left vertebral artery (series 453, image 7). The distal right vertebral appears normal including the right PICA origin. Antegrade flow in the basilar artery. Normal SCA and left PCA origins. Fetal type right PCA origin. Small left posterior communicating artery. Bilateral PCA branches are normal. Antegrade flow in both ICA siphons, however, seemingly asymmetrically decreased flow signal in the left ICA siphon and terminus (series 454, image 8). No ICA siphon stenosis. Ophthalmic and posterior communicating artery origins appear normal. Both carotid termini appear patent. MCA and ACA origins appear within normal limits. Diminutive or absent anterior communicating artery. However, there is decreased/absent flow signal throughout left ACA (beginning on series 4, image 108 MRA source images). There is bilateral MCA M1 segment, MCA bifurcation, and bilateral MCA branch flow signal. The right MCA branches appear normal. IMPRESSION: 1. Limited MRI of the brain without evidence of acute infarct. 2. Abnormal left side vessel findings on intracranial MRA which may be artifactual due to susceptibility (which also limits visualization of the inferior left cerebellum on diffusion). Still, follow-up CTA head and neck would be necessary to confirm normal patency of the left vertebral artery, the left ICA, and the left ACA. Electronically Signed   By: Genevie Ann M.D.   On: 08/04/2016 17:00   Mr Brain Limited Wo Contrast  Result Date: 08/04/2016 CLINICAL DATA:  46 year old male code stroke. Slurred speech and left weakness with facial droop. Last seen normal 1250 hours. EXAM: LIMITED MRI HEAD WITHOUT CONTRAST MRA HEAD WITHOUT CONTRAST TECHNIQUE: MRI imaging the brain and surrounding structures were obtained without intravenous contrast. Angiographic images of the head were obtained using MRA technique without  contrast. COMPARISON:  CT head without contrast 1331 hours today. FINDINGS: MRI HEAD FINDINGS Diffusion-weighted imaging and axial FLAIR imaging only was performed. Susceptibility artifact from an unknown into the causes signal loss at the left posterior fossa. This affects the inferior left cerebellar visualization on diffusion imaging. No restricted diffusion  to suggest acute infarction. No midline shift, mass effect, evidence of mass lesion, ventriculomegaly, extra-axial collection or acute intracranial hemorrhage. FLAIR signal is within normal limits. MRA HEAD FINDINGS Antegrade flow in the distal right vertebral artery, but seemingly absent antegrade flow signal in the distal left vertebral artery (series 453, image 7). The distal right vertebral appears normal including the right PICA origin. Antegrade flow in the basilar artery. Normal SCA and left PCA origins. Fetal type right PCA origin. Small left posterior communicating artery. Bilateral PCA branches are normal. Antegrade flow in both ICA siphons, however, seemingly asymmetrically decreased flow signal in the left ICA siphon and terminus (series 454, image 8). No ICA siphon stenosis. Ophthalmic and posterior communicating artery origins appear normal. Both carotid termini appear patent. MCA and ACA origins appear within normal limits. Diminutive or absent anterior communicating artery. However, there is decreased/absent flow signal throughout left ACA (beginning on series 4, image 108 MRA source images). There is bilateral MCA M1 segment, MCA bifurcation, and bilateral MCA branch flow signal. The right MCA branches appear normal. IMPRESSION: 1. Limited MRI of the brain without evidence of acute infarct. 2. Abnormal left side vessel findings on intracranial MRA which may be artifactual due to susceptibility (which also limits visualization of the inferior left cerebellum on diffusion). Still, follow-up CTA head and neck would be necessary to confirm normal  patency of the left vertebral artery, the left ICA, and the left ACA. Electronically Signed   By: Genevie Ann M.D.   On: 08/04/2016 17:00   Ct Head Code Stroke W/o Cm  Result Date: 08/04/2016 CLINICAL DATA:  Code stroke. New onset of slurred speech. Last seen well at 12:50 p.m. Left facial droop. Left sided weakness. EXAM: CT HEAD WITHOUT CONTRAST TECHNIQUE: Contiguous axial images were obtained from the base of the skull through the vertex without intravenous contrast. COMPARISON:  None. FINDINGS: Brain: No acute infarct, hemorrhage, or mass lesion is present. The basal ganglia are intact bilaterally. The insular ribbon is normal. No acute or focal cortical abnormality is present. Brainstem and cerebellum are normal. Vascular: No hyperdense vessel or unexpected calcification. Skull: The calvarium is intact. No focal lytic or blastic lesions are present. No significant extracranial soft tissue abnormalities are present. Sinuses/Orbits: The paranasal sinuses and mastoid air cells are clear. ASPECTS [facility] Stroke Program Early CT Score) - Ganglionic level infarction (caudate, lentiform nuclei, internal capsule, insula, M1-M3 cortex): 7/7 - Supraganglionic infarction (M4-M6 cortex): 3/3 Total score (0-10 with 10 being normal): 10/10 IMPRESSION: 1. Normal CT appearance of the brain 2. ASPECTS is 10/10 These results were called by telephone at the time of interpretation on 08/04/2016 at 1:47 pm to Dr. Charlotte Crumb , who verbally acknowledged these results. Electronically Signed   By: San Morelle M.D.   On: 08/04/2016 13:47    Assessment: 46 y.o. male with acute onset of left sided numbness, weakness and headache 1. Limited MRI of the brain without evidence of acute infarct.  2. MRA of head is compromised by artifact. Equivocal abnormal left side vessel findings on intracranial MRA may be artifactual due to susceptibility. No LVO corresponding to the circulation supplying the right cerebral hemisphere. Not  an endovascular candidate.  3. Recent shingles involving left side of face. VZV vasculitis is possible but unlikely.  4. Iodine contrast allergy 5. Difficult IV access  Plan: 1. HgbA1c, fasting lipid panel 2. Full MRI of the brain with contrast.  3. PT consult, OT consult, Speech consult 4. Echocardiogram 5. Carotid  dopplers 6. ASA 81 mg po qd 7. Risk factor modification 8. Telemetry monitoring 9. Frequent neuro checks 10. Permissive HTN x 24 hours.  11. Given equivocal findings on MRA head, consider CTA of head and neck. He has been premedicated. If unable to obtain CTA this evening due to IV access issues,   @Electronically  signed: Dr. Kerney Elbe  08/04/2016, 6:19 PM

## 2016-08-04 NOTE — ED Notes (Signed)
Pt still c/o lt eye pain no orders placed on this pt for admission yet

## 2016-08-04 NOTE — ED Triage Notes (Addendum)
Onset slurred speech 30 min ago at 1250 per officer. [facility] being [facility] to prison when symptoms started in car. Left facial droop. Weak left grip weak. Off balance on left leg.

## 2016-08-04 NOTE — ED Notes (Signed)
Stroke nurse comment: Patient presented to triage in police custody, registered at [id number]. During prison transfer patient was noted to have sudden onset of slurred speech, left facial droop, and weakness on left side.  See chart for neuro exam.  Exam difficult to perform due to the patient's wrist and ankle shackles, officer could not find the key and had to search the transport vehicle for the correct key. Delay in obtaining peripheral IV x2, multiple attempts by multiple staff which in turn led to a delay in treating blood pressure.  BP treated with 20mg  IV  labetalol with no success, nicardipine drip started, BP parameters, IV tPA started at 1429.  Patient continues to complain of left eye pain on discharge, NIHSS 9 on discharge.  Patient given pre-contrast meds (Benadryl and steroid) prior to departure. Patient left the facility at 1515 with [facility].

## 2016-08-04 NOTE — ED Notes (Signed)
Attempted report x1. 

## 2016-08-04 NOTE — ED Notes (Signed)
Swallow screen stopped after he reported difficulty swallowing  He did not pass swallow screen

## 2016-08-04 NOTE — ED Notes (Signed)
Pt did not pass the swallow screen 

## 2016-08-04 NOTE — ED Notes (Signed)
Deputy sheriffs at  The bedside

## 2016-08-04 NOTE — Code Documentation (Signed)
46yo [facility] via [facility].  [facility] with sudden onset slurred speech, left facial droop and left sided weakness while being transferred to a prison by Designer, television/film set[employer] enforcement officers.  LKW 1250 per LEO.  [facility] and administered tPA at 1429.  [facility]. Patient with iodine allergy and given IV Benadryl and IV Hydrocortisone prior to transfer.  Stroke team at the bedside on patient arrival.  Patient to Trauma A.  Patient without appropriate PIV access to complete CTA at this time.  Patient with multiple unsuccessful [facility] attempts in bilateral [facility].  IV team notified and to bedside to attempt PIV.  NIHSS 3, see documentation for details and code stroke times.  Patient with mild left facial droop, left arm drift and reporting decreased sensation on the left side on exam.  Patient with inconsistent exams, MD aware.  Of note, patient with recent h/o shingles to left eye, patient reporting left eye pain when he looks to the left.  Dr. Otelia LimesLindzen at the bedside.  Patient to have MRI instead of CTA per MD.  MRI staff made aware.  Patient transported to MRI with team.  Patient's BP reassessed in MRI, see documentation.  Cardene stopped, MD aware.  Patient reassessed neurologically prior to MRI imaging.  Vital signs monitored during MRI imaging by ED RN. MRI/A reviewed by Dr. Otelia LimesLindzen.  Bedside handoff with ED RN Thayer Ohmhris.

## 2016-08-04 NOTE — ED Notes (Signed)
Still c/o a lt eye pain

## 2016-08-04 NOTE — ED Provider Notes (Signed)
Patient transferred here from our affiliated facility due to concern of new neurologic change. Patient was greeted by our neurology team, myself. Airway is patent, further evaluation conducted by neurology.  On repeat exam the patient is calm.  I discussed the findings with the patient's Sheriff companions. Though the neurologic deficits seem inconsistent, patient was admitted for further evaluation and management with consideration of stroke.     Gerhard MunchLockwood, Madilyn Cephas, MD 08/04/16 1919

## 2016-08-04 NOTE — ED Notes (Signed)
The pt returned from mri at 1650  Alert oriented  Handcuffed on the lt foot and attached to the stretcher by the deputy sheriffs.

## 2016-08-04 NOTE — ED Notes (Addendum)
The tpa was stopped by [facility] on the way here 1535.  No ivs running now since mri

## 2016-08-04 NOTE — Progress Notes (Signed)
CH received page code stroke room 5. Patient was brought in unresponsive. CH remained with staff and prayed for Patient.   08/04/16 1350  Clinical Encounter Type  Visited With Patient  Visit Type Initial;Spiritual support  Referral From Nurse  Consult/Referral To Chaplain  Spiritual Encounters  Spiritual Needs Prayer;Emotional

## 2016-08-04 NOTE — ED Notes (Signed)
deputys at  The bedside pt remains in foot shackle lt

## 2016-08-04 NOTE — ED Notes (Signed)
To mri now.

## 2016-08-04 NOTE — ED Notes (Signed)
Pt c/o a severe lt eye pain  Shingled lesion just under his lt eye.  No orders placed by admitting doctor yet

## 2016-08-04 NOTE — ED Provider Notes (Signed)
[facility] Emergency Department Provider Note  ____________________________________________  Time seen: Approximately 3:14 PM  I have reviewed the triage vital signs and the nursing notes.   HISTORY  Chief Complaint Stroke Symptoms   HPI Cody Parks is a 46 y.o. male no significant past medical history who presents as a code stroke. Patient was in his usual state of health being transported in a police car when the officer noticed the patient started have slurred speechand L sided deficits, so he drove him to the ER instead. Patient was last seen normal at 12:50 PM. No prior history of stroke. No family history of stroke. The patient is a smoker. Patient is complaining of headache located behind his left eye that is worse when he turns his eye to the left or light's flash in his eye. The headache is moderate in intensity, constant since the onset of his neurological symptoms, and nonradiating. Patient is also complaining of left-sided weakness and numbness in both upper and lower extremities.  History reviewed. No pertinent past medical history.  There are no active problems to display for this patient.   History reviewed. No pertinent surgical history.  Prior to Admission medications   Medication Sig Start Date End Date Taking? Authorizing Provider  acetaminophen (TYLENOL) 325 MG tablet Take 650 mg by mouth every 6 (six) hours as needed.   Yes [provider]  amLODipine (NORVASC) 10 MG tablet Take 10 mg by mouth daily. 06/10/16 06/10/17 Yes [provider]  calcium carbonate (TUMS - DOSED IN MG ELEMENTAL CALCIUM) 500 MG chewable tablet Chew 2 tablets by mouth 2 (two) times daily as needed for indigestion or heartburn.   Yes [provider]  Carboxymethylcellulose Sodium (THERATEARS) 0.25 % SOLN Place 1 drop into both eyes 4 (four) times daily. 06/02/16  Yes [provider]  gabapentin (NEURONTIN) 300 MG capsule Take 300 mg by  mouth 3 (three) times daily.  06/02/16  Yes [provider]  hydrochlorothiazide (HYDRODIURIL) 50 MG tablet Take 50 mg by mouth daily.   Yes [provider]  ranitidine (ZANTAC) 150 MG tablet Take 150 mg by mouth daily.   Yes [provider]  senna (SENOKOT) 8.6 MG tablet Take 2 tablets by mouth at bedtime.   Yes [provider]  doxycycline (VIBRAMYCIN) 100 MG capsule Take 1 capsule (100 mg total) by mouth 2 (two) times daily. 06/23/16   Little, Ambrose Finland, MD    Allergies Iodine; Shellfish allergy; Codeine phosphate [codeine]; Lanolin anhydrous [lanolin]; and Propranolol  History reviewed. No pertinent family history.  Social History Social History  Substance Use Topics  . Smoking status: Never Smoker  . Smokeless tobacco: Never Used  . Alcohol use Yes    Review of Systems  Constitutional: Negative for fever. Eyes: Negative for visual changes. ENT: Negative for sore throat. Neck: No neck pain  Cardiovascular: Negative for chest pain. Respiratory: Negative for shortness of breath. Gastrointestinal: Negative for abdominal pain, vomiting or diarrhea. Genitourinary: Negative for dysuria. Musculoskeletal: Negative for back pain. Skin: Negative for rash. Neurological: + HA, L sided weakness, and slurred speech Psych: No SI or HI  ____________________________________________   PHYSICAL EXAM:  VITAL SIGNS: ED Triage Vitals  Enc Vitals Group     BP 08/04/16 1338 (!) 171/101     Pulse Rate 08/04/16 1340 76     Resp 08/04/16 1340 (!) 21     Temp --      Temp src --  SpO2 08/04/16 1340 100 %     Weight 08/04/16 1418 195 lb (88.5 kg)     Height --      Head Circumference --      Peak Flow --      Pain Score --      Pain Loc --      Pain Edu? --      Excl. in GC? --     Constitutional: Alert and oriented x3, no distress.  HEENT:      Head: Normocephalic and atraumatic.         Eyes: Conjunctivae are normal. Sclera is  non-icteric.       Mouth/Throat: Mucous membranes are moist.       Neck: Supple with no signs of meningismus. Cardiovascular: Regular rate and rhythm. No murmurs, gallops, or rubs. 2+ symmetrical distal pulses are present in all extremities. No JVD. Respiratory: Normal respiratory effort. Lungs are clear to auscultation bilaterally. No wheezes, crackles, or rhonchi.  Gastrointestinal: Soft, non tender, and non distended with positive bowel sounds. No rebound or guarding. Genitourinary: No CVA tenderness. Musculoskeletal: Nontender with normal range of motion in all extremities. No edema, cyanosis, or erythema of extremities. Neurologic: Slurred speech, left-sided facial droop, left-sided upper and lower extremity weakness with dysmetria. Skin: Skin is warm, dry and intact. No rash noted. Psychiatric: Mood and affect are normal. Speech and behavior are normal.  NIH Stroke Scale  Interval: Baseline Time: 3:17 PM Person Administering Scale: [place]  Administer stroke scale items in the order listed. Record performance in each category after each subscale exam. Do not go back and change scores. Follow directions provided for each exam technique. Scores should reflect what the patient does, not what the clinician thinks the patient can do. The clinician should record answers while administering the exam and work quickly. Except where indicated, the patient should not be coached (i.e., repeated requests to patient to make a special effort).   1a  Level of consciousness: 0=alert; keenly responsive  1b. LOC questions:  0=Performs both tasks correctly  1c. LOC commands: 0=Performs both tasks correctly  2.  Best Gaze: 0=normal  3.  Visual: 0=No visual loss  4. Facial Palsy: 1=Minor paralysis (flattened nasolabial fold, asymmetric on smiling)  5a.  Motor left arm: 2=Some effort against gravity, limb cannot get to or maintain (if cured) 90 (or 45) degrees, drifts down to bed, but has some  effort against gravity  5b.  Motor right arm: 0=No drift, limb holds 90 (or 45) degrees for full 10 seconds  6a. motor left leg: 2=Some effort against gravity, limb cannot get to or maintain (if cured) 90 (or 45) degrees, drifts down to bed, but has some effort against gravity  6b  Motor right leg:  0=No drift, limb holds 90 (or 45) degrees for full 10 seconds  7. Limb Ataxia: 2=Present in two limbs  8.  Sensory: 1=Mild to moderate sensory loss; patient feels pinprick is less sharp or is dull on the affected side; there is a loss of superficial pain with pinprick but patient is aware He is being touched  9. Best Language:  0=No aphasia, normal  10. Dysarthria: 1=Mild to moderate, patient slurs at least some words and at worst, can be understood with some difficulty  11. Extinction and Inattention: 0=No abnormality   Total:   7    ____________________________________________   LABS (all labs ordered are listed, but only abnormal results are displayed)  Labs Reviewed  CBC -  Abnormal; Notable for the following:       Result Value   RDW 14.6 (*)    All other components within normal limits  PROTIME-INR  APTT  DIFFERENTIAL  COMPREHENSIVE METABOLIC PANEL  TROPONIN I  GLUCOSE, CAPILLARY  CBG MONITORING, ED   ____________________________________________  EKG  ED ECG REPORT I, Nita Sickle, the attending physician, personally viewed and interpreted this ECG.  Normal sinus rhythm, rate of 87, normal intervals, normal axis, no ST elevations or depressions.  ____________________________________________  RADIOLOGY  Head Ct:  1. Normal CT appearance of the brain ____________________________________________   PROCEDURES  Procedure(s) performed: None Procedures Critical Care performed: yes  CRITICAL CARE Performed by: Nita Sickle  ?  Total critical care time: 35  Critical care time was exclusive of separately billable procedures and treating other  patients.  Critical care was necessary to treat or prevent imminent or life-threatening deterioration.  Critical care was time spent personally by me on the following activities: development of treatment plan with patient and/or surrogate as well as nursing, discussions with consultants, evaluation of patient's response to treatment, examination of patient, obtaining history from patient or surrogate, ordering and performing treatments and interventions, ordering and review of laboratory studies, ordering and review of radiographic studies, pulse oximetry and re-evaluation of patient's condition.  ____________________________________________   INITIAL IMPRESSION / ASSESSMENT AND PLAN / ED COURSE  46 y.o. male no significant past medical history who presents as a code stroke with last seen normal at 12:50PM. Patient evaluated by neurology and myself upon arrival to the emergency room with significant deficits including slurred speech and left-sided weakness. The CT head negative for hemorrhagic stroke. Patient was given TPA. Discussion about recommendation, risks and benefits of TPA was performed by Dr. Thad Ranger, neurologist in house. BP managed with labetalol unsuccessfully and patient was started on Cardene. Patient was then transfer to neuro ICU at [facility] for further monitoring. Patient left ER in stable but guarded condition.     Pertinent labs & imaging results that were available during my care of the patient were reviewed by me and considered in my medical decision making (see chart for details).    ____________________________________________   FINAL CLINICAL IMPRESSION(S) / ED DIAGNOSES  Final diagnoses:  Cerebrovascular accident (CVA), unspecified mechanism (HCC)      NEW MEDICATIONS STARTED DURING THIS VISIT:  New Prescriptions   No medications on file     Note:  This document was prepared using Dragon voice recognition software and may include unintentional dictation  errors.    Nita Sickle, MD 08/04/16 [phone]

## 2016-08-04 NOTE — ED Notes (Signed)
Cody Parks with [facility] took 200mg  Solucortef with them to administer in the truck enroute due to inability to wait at [facility] ED. [facility] notified. Dr Thad Rangereynolds notified

## 2016-08-04 NOTE — ED Notes (Signed)
Multiple sticks  On arms    All sticks oozing  From the tpa.  The tpa infused 1535   Lt sided  flacid by [facility]  Some movement lt arm on the way here   Pupils equal and react.  Iv cardene still infusing

## 2016-08-04 NOTE — ED Notes (Signed)
No admission orders in yet

## 2016-08-04 NOTE — ED Triage Notes (Signed)
The pt arrived by [facility] from [place]   With code stroke  He was lsn  Per [facility]  1330  A code stroke was called when the deputies showed up  With the pt  He arrived here by [facility] at 1540.  Pt c/o rt eye pain  He had shingles lt face under his lt eye and lt chest 3 weeks ago  .
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
## 2016-08-04 NOTE — Consult Note (Addendum)
Referring Physician: Don Perking    Chief Complaint: Left sided numbness and weakness, headache  HPI: Cody Parks is an 46 y.o. male with a history of shingles in his left eye who was in transport today from one facility to another who had the acute onset of left eye pain.  Patient also had associated left sided numbness and weakness.  Reported pain on looking to the left but also reported difficulty seeing to the left.  Noted to have have left facial droop and dysarthria.  Initial NIHSS of 10.    Date last known well: Date: 08/04/2016 Time last known well: Time: 12:50 tPA Given: Yes  Past medical history:  Shingles  History reviewed. No pertinent surgical history.  Family history: Maternal grandmother with MS and heart disease.  Mother with heart disease.  Maternal grandfather with heart disease.  Paternal grandparents with heart disease.  Social History:  reports that he has never smoked. He has never used smokeless tobacco. He reports that he drinks alcohol. His drug history is not on file. Patient has a history of marijuana use.  Allergies:  Allergies  Allergen Reactions  . Iodine Swelling    IV contrast dye  . Shellfish Allergy Anaphylaxis  . Codeine Phosphate [Codeine]   . Lanolin Anhydrous [Lanolin]   . Propranolol     Medications: Neurontin  ROS: History obtained from the patient  General ROS: negative for - chills, fatigue, fever, night sweats, weight gain or weight loss Psychological ROS: negative for - behavioral disorder, hallucinations, memory difficulties, mood swings or suicidal ideation Ophthalmic ROS: left eye pain ENT ROS: negative for - epistaxis, nasal discharge, oral lesions, sore throat, tinnitus or vertigo Allergy and Immunology ROS: negative for - hives or itchy/watery eyes Hematological and Lymphatic ROS: negative for - bleeding problems, bruising or swollen lymph nodes Endocrine ROS: negative for - galactorrhea, hair pattern changes, polydipsia/polyuria  or temperature intolerance Respiratory ROS: negative for - cough, hemoptysis, shortness of breath or wheezing Cardiovascular ROS: negative for - chest pain, dyspnea on exertion, edema or irregular heartbeat Gastrointestinal ROS: negative for - abdominal pain, diarrhea, hematemesis, nausea/vomiting or stool incontinence Genito-Urinary ROS: negative for - dysuria, hematuria, incontinence or urinary frequency/urgency Musculoskeletal ROS: negative for - joint swelling or muscular weakness Neurological ROS: as noted in HPI Dermatological ROS: negative for rash and skin lesion changes  Physical Examination: Blood pressure 132/87, pulse 90, resp. rate 11, weight 88.5 kg (195 lb), SpO2 100 %.  HEENT-  Normocephalic, no lesions, without obvious abnormality.  Normal external eye and conjunctiva.  Normal TM's bilaterally.  Normal auditory canals and external ears. Normal external nose, mucus membranes and septum.  Normal pharynx. Cardiovascular- S1, S2 normal, pulses palpable throughout   Lungs- chest clear, no wheezing, rales, normal symmetric air entry Abdomen- soft, non-tender; bowel sounds normal; no masses,  no organomegaly Extremities- no edema Lymph-no adenopathy palpable Musculoskeletal-no joint tenderness, deformity or swelling Skin-warm and dry, no hyperpigmentation, vitiligo, or suspicious lesions  Neurological Examination   Mental Status: Alert, oriented, thought content appropriate.  Speech fluent without evidence of aphasia but dysarthric.  Able to follow 3 step commands without difficulty. Cranial Nerves: II: Discs flat bilaterally; LHH, pupils equal, round, reactive to light and accommodation III,IV, VI: left ptosis, extra-ocular motions intact bilaterally but pain on looking to the left V,VII: left facial droop, facial light touch sensation decreased on the left VIII: hearing normal bilaterally IX,X: gag reflex present XI: bilateral shoulder shrug XII: midline tongue  extension Motor: Right : Upper  extremity   5/5    Left:     Upper extremity   3/5  Lower extremity   5/5     Lower extremity   3-/5 Tone and bulk:normal tone throughout; no atrophy noted Sensory: Pinprick and light touch decreased on the left Deep Tendon Reflexes: 2+ and symmetric throughout Plantars: Right: downgoing   Left: downgoing Cerebellar: Normal finger-to-nose testing on the right.  Unable to perform on the left due to weakness.  Normal heel-to-shin testing on the right.  Unable to perform on the left due to weakness Gait: not tested due to safety concerns    Laboratory Studies:  Basic Metabolic Panel:  Recent Labs Lab 08/04/16 1406  NA 139  K 4.6  CL 106  CO2 26  GLUCOSE 90  BUN 12  CREATININE 0.99  CALCIUM 10.1    Liver Function Tests:  Recent Labs Lab 08/04/16 1406  AST 24  ALT 17  ALKPHOS 54  BILITOT 0.6  PROT 7.5  ALBUMIN 4.5   No results for input(s): LIPASE, AMYLASE in the last 168 hours. No results for input(s): AMMONIA in the last 168 hours.  CBC:  Recent Labs Lab 08/04/16 1406  WBC 8.6  NEUTROABS 6.2  HGB 14.1  HCT 41.6  MCV 93.3  PLT 189    Cardiac Enzymes:  Recent Labs Lab 08/04/16 1406  TROPONINI <0.03    BNP: Invalid input(s): POCBNP  CBG:  Recent Labs Lab 08/04/16 1338  GLUCAP 86    Microbiology: Results for orders placed or performed during the hospital encounter of 06/23/16  Urine culture     Status: None   Collection Time: 06/23/16  9:27 PM  Result Value Ref Range Status   Specimen Description URINE, CLEAN CATCH  Final   Special Requests NONE  Final   Culture   Final    NO GROWTH Performed at [facility], [address] [place], [place] [address]    Report Status 06/25/2016 FINAL  Final    Coagulation Studies:  Recent Labs  08/04/16 1406  LABPROT 12.5  INR 0.93    Urinalysis: No results for input(s): COLORURINE, LABSPEC, PHURINE, GLUCOSEU, HGBUR, BILIRUBINUR, KETONESUR, PROTEINUR,  UROBILINOGEN, NITRITE, LEUKOCYTESUR in the last 168 hours.  Invalid input(s): APPERANCEUR  Lipid Panel: No results found for: CHOL, TRIG, HDL, CHOLHDL, VLDL, LDLCALC  HgbA1C: No results found for: HGBA1C  Urine Drug Screen:  No results found for: LABOPIA, COCAINSCRNUR, LABBENZ, AMPHETMU, THCU, LABBARB  Alcohol Level: No results for input(s): ETH in the last 168 hours.  Other results: EKG: sinus rhythm at 87 bpm.  Imaging: Ct Head Code Stroke W/o Cm  Result Date: 08/04/2016 CLINICAL DATA:  Code stroke. New onset of slurred speech. Last seen well at 12:50 p.m. Left facial droop. Left sided weakness. EXAM: CT HEAD WITHOUT CONTRAST TECHNIQUE: Contiguous axial images were obtained from the base of the skull through the vertex without intravenous contrast. COMPARISON:  None. FINDINGS: Brain: No acute infarct, hemorrhage, or mass lesion is present. The basal ganglia are intact bilaterally. The insular ribbon is normal. No acute or focal cortical abnormality is present. Brainstem and cerebellum are normal. Vascular: No hyperdense vessel or unexpected calcification. Skull: The calvarium is intact. No focal lytic or blastic lesions are present. No significant extracranial soft tissue abnormalities are present. Sinuses/Orbits: The paranasal sinuses and mastoid air cells are clear. ASPECTS [facility]([place] Stroke Program Early CT Score) - Ganglionic level infarction (caudate, lentiform nuclei, internal capsule, insula, M1-M3 cortex): 7/7 - Supraganglionic  infarction (M4-M6 cortex): 3/3 Total score (0-10 with 10 being normal): 10/10 IMPRESSION: 1. Normal CT appearance of the brain 2. ASPECTS is 10/10 These results were called by telephone at the time of interpretation on 08/04/2016 at 1:47 pm to Dr. Ileana Roup , who verbally acknowledged these results. Electronically Signed   By: Marin Roberts M.D.   On: 08/04/2016 13:47    Assessment: 46 y.o. male presenting with left sided symptoms and left eye pain.  Head  CT reviewed and shows no acute changes.  Concern was for acute infarct.   Initial evaluation delayed due to the fact that keys for his shackles had to be found.   Contraindications reviewed.  Risks and benefits of tPA discussed.  Verbal consent obtained.  Administration delayed due to difficult access-multiple attempts were required and IV team required to get two lines.  BP was then elevated.  BP did not respond to Labetalol and required start of Cardene before able to controlled.  tPA then administered.  No initial change in NIHSS but patient with more difficulty using the left side prior to administration of bolus.     Stroke Risk Factors - none  Plan: 1. Transfer to [facility] for further evaluation and treatment.  Dr. Otelia Limes is the accepting physician 2.  Patient premedicated with Benadryl 50mg  and Solu-cortef 200mg  prior to transfer  This patient is critically ill and at significant risk of neurological worsening, death and care requires constant monitoring of vital signs, hemodynamics,respiratory and cardiac monitoring, neurological assessment, discussion with family, other specialists and medical decision making of high complexity. I spent 105 minutes of neurocritical care time  in the care of  this patient.   Thana Farr, MD Neurology [phone] 08/04/2016, 2:50 PM

## 2016-08-05 ENCOUNTER — Inpatient Hospital Stay (HOSPITAL_COMMUNITY)

## 2016-08-05 DIAGNOSIS — B0229 Other postherpetic nervous system involvement: Secondary | ICD-10-CM

## 2016-08-05 DIAGNOSIS — R109 Unspecified abdominal pain: Secondary | ICD-10-CM

## 2016-08-05 LAB — CBC
HEMATOCRIT: 44.3 % (ref 39.0–52.0)
HEMOGLOBIN: 14.6 g/dL (ref 13.0–17.0)
MCH: 30.9 pg (ref 26.0–34.0)
MCHC: 33 g/dL (ref 30.0–36.0)
MCV: 93.9 fL (ref 78.0–100.0)
Platelets: 186 10*3/uL (ref 150–400)
RBC: 4.72 MIL/uL (ref 4.22–5.81)
RDW: 14.2 % (ref 11.5–15.5)
WBC: 12.1 10*3/uL — ABNORMAL HIGH (ref 4.0–10.5)

## 2016-08-05 LAB — LIPID PANEL
CHOLESTEROL: 169 mg/dL (ref 0–200)
HDL: 45 mg/dL (ref >40)
LDL Cholesterol: 106 mg/dL — ABNORMAL HIGH (ref 0–99)
Total CHOL/HDL Ratio: 3.8 RATIO
Triglycerides: 88 mg/dL (ref <150)
VLDL: 18 mg/dL (ref 0–40)

## 2016-08-05 LAB — MRSA PCR SCREENING: MRSA by PCR: NEGATIVE

## 2016-08-05 LAB — HIV ANTIBODY (ROUTINE TESTING W REFLEX): HIV Screen 4th Generation wRfx: NONREACTIVE

## 2016-08-05 MED ORDER — VALACYCLOVIR HCL 500 MG PO TABS
1000.0000 mg | ORAL_TABLET | Freq: Three times a day (TID) | ORAL | Status: DC
  Administered 2016-08-05 – 2016-08-08 (×9): 1000 mg via ORAL
  Filled 2016-08-05 (×9): qty 2

## 2016-08-05 MED ORDER — ATORVASTATIN CALCIUM 40 MG PO TABS
40.0000 mg | ORAL_TABLET | Freq: Every day | ORAL | Status: DC
  Administered 2016-08-05 – 2016-08-06 (×2): 40 mg via ORAL
  Filled 2016-08-05 (×2): qty 1

## 2016-08-05 MED ORDER — PANTOPRAZOLE SODIUM 40 MG PO TBEC
40.0000 mg | DELAYED_RELEASE_TABLET | Freq: Every day | ORAL | Status: DC
  Administered 2016-08-05 – 2016-08-07 (×3): 40 mg via ORAL
  Filled 2016-08-05 (×3): qty 1

## 2016-08-05 MED ORDER — HYDROCODONE-ACETAMINOPHEN 5-325 MG PO TABS
1.0000 | ORAL_TABLET | Freq: Three times a day (TID) | ORAL | Status: DC | PRN
  Administered 2016-08-05 – 2016-08-08 (×7): 1 via ORAL
  Filled 2016-08-05 (×7): qty 1

## 2016-08-05 MED ORDER — ASPIRIN 81 MG PO CHEW
81.0000 mg | CHEWABLE_TABLET | Freq: Every day | ORAL | Status: DC
  Administered 2016-08-05 – 2016-08-07 (×3): 81 mg via ORAL
  Filled 2016-08-05 (×3): qty 1
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
## 2016-08-05 NOTE — Evaluation (Signed)
Physical Therapy Evaluation Patient Details Name: Cody Parks MRN: [id number] DOB: 1971-01-06 Today's Date: 08/05/2016   History of Present Illness  46 y.o. male who presented to [facility] with acute onset of left sided weakness and numbness, in conjunction with left eye pain which was exacerbated by looking towards the left. Also with left facial droop and dysarthria, as well as "difficulty seeing to the left". Initial NIHSS at [facility] was 10. He received IV tPA at [facility] and was emergently transported to [facility] for possible thrombectomy. MRI negative for acute infarct.  Clinical Impression  Patient seen for mobility assessment. At this time, patient responses to assessment are inconsistent at best and deviations in gait and function are unlike any combination of deviations ever seen by this therapist. Un-likely to see significant gains but will trial acute PT as indicated. Recommend return to detention facility upon discharge.     Follow Up Recommendations No PT follow up    Equipment Recommendations  None recommended by PT    Recommendations for Other Services       Precautions / Restrictions Precautions Precaution Comments: corrections officers present, cuffs applied Restrictions Weight Bearing Restrictions: No      Mobility  Bed Mobility Overal bed mobility: Modified Independent             General bed mobility comments: increased time and effort, max cues to perform tasks, no physical assist required  Transfers Overall transfer level: Needs assistance Equipment used: None Transfers: Sit to/from Stand Sit to Stand: Min guard         General transfer comment: min guard for safety, no physical assist required. patient with some instability upon standing reports he feels he is listing to the right side.   Ambulation/Gait Ambulation/Gait assistance: Min assist Ambulation Distance (Feet): 20 Feet Assistive device: None Gait Pattern/deviations: Narrow base of support;Staggering  right;Staggering left;Step-to pattern;Decreased stride length;Shuffle Gait velocity: decreased Gait velocity interpretation: <1.8 ft/sec, indicative of risk for recurrent falls General Gait Details: patient gait inconsistent with any pattern of deviation seen by this therapist. patient reports LLE deficits upon assessment prior to mobility but during ambulation, performs gait with altered patterns in RLE, then upon questioning RLE deficits, patient alters gait deficits to left side. Poor effort to maintain balance by patient.   Stairs            Wheelchair Mobility    Modified Rankin (Stroke Patients Only) Modified Rankin (Stroke Patients Only) Pre-Morbid Rankin Score: No symptoms Modified Rankin: Moderate disability     Balance Overall balance assessment: Needs assistance Sitting-balance support: Feet supported Sitting balance-Leahy Scale: Good       Standing balance-Leahy Scale: Fair               High level balance activites: Turns High Level Balance Comments: poor effort to maintain balance during turns             Pertinent Vitals/Pain Pain Assessment: Faces Faces Pain Scale: Hurts even more Pain Location: left eye Pain Descriptors / Indicators: Discomfort Pain Intervention(s): Monitored during session    Home Living Family/patient expects to be discharged to:: Dentention/Prison                      Prior Function Level of Independence: Independent               Hand Dominance   Dominant Hand: Right    Extremity/Trunk Assessment   Upper Extremity Assessment Upper Extremity Assessment: Defer to OT evaluation  Lower Extremity Assessment Lower Extremity Assessment: RLE deficits/detail;LLE deficits/detail RLE Deficits / Details: inconsistent effort with assessment, at times shows full strength and ROM, other times demostrates deficits fluctuation left side to right side RLE Coordination: decreased gross motor (during gait  attempt, but states no issues with RLE) LLE Deficits / Details: inconsistent effort with assessment, at times shows full strength and ROM, other times demostrates deficits fluctuation left side to right side LLE Sensation: decreased light touch LLE Coordination: decreased fine motor;decreased gross motor (inconsistent effort and findings)       Communication   Communication:  (visual deficits reported)  Cognition Arousal/Alertness: Awake/alert Behavior During Therapy: Flat affect;Impulsive Overall Cognitive Status: Impaired/Different from baseline Area of Impairment: Safety/judgement                               General Comments: patient with poor effort and inconsistency throughout examination, some agitation when being asked to perform tasks such      General Comments      Exercises     Assessment/Plan    PT Assessment Patient needs continued PT services  PT Problem List Decreased activity tolerance;Decreased balance;Decreased mobility;Pain       PT Treatment Interventions Gait training;Functional mobility training;Therapeutic activities;Therapeutic exercise;Balance training;Neuromuscular re-education    PT Goals (Current goals can be found in the Care Plan section)  Acute Rehab PT Goals Patient Stated Goal: none stated PT Goal Formulation: With patient Time For Goal Achievement: 08/19/16 Potential to Achieve Goals: Fair    Frequency Min 3X/week   Barriers to discharge        Co-evaluation               AM-PAC PT "6 Clicks" Daily Activity  Outcome Measure Difficulty turning over in bed (including adjusting bedclothes, sheets and blankets)?: A Little Difficulty moving from lying on back to sitting on the side of the bed? : A Little Difficulty sitting down on and standing up from a chair with arms (e.g., wheelchair, bedside commode, etc,.)?: A Little Help needed moving to and from a bed to chair (including a wheelchair)?: A Little Help needed  walking in hospital room?: A Little Help needed climbing 3-5 steps with a railing? : A Little 6 Click Score: 18    End of Session   Activity Tolerance: Patient tolerated treatment well Patient left: in bed;with call bell/phone within reach (correction officers present, ) Nurse Communication: Mobility status PT Visit Diagnosis: Other symptoms and signs involving the nervous system (N82.956(R29.898)    Time: [id number] PT Time Calculation (min) (ACUTE ONLY): 25 min   Charges:   PT Evaluation $PT Eval Moderate Complexity: 1 Procedure     PT G Codes:        Charlotte Crumbevon Evolet Salminen, PT DPT  [phone]   Fabio Asaevon J Adien Kimmel 08/05/2016, 11:52 AM
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
## 2016-08-05 NOTE — Progress Notes (Addendum)
Via the room's video feed, patient seen with full movement of left side, and yawning, visualizing nasal folds equally. Presenting differently than when assessed at bedside.

## 2016-08-05 NOTE — Progress Notes (Signed)
Received from 4N via bed,  VSS C/o left eye pain, R scrotal pain.  Gave ice pack & instructed to put ice under right scrotum.  IV Team in to maintain NSL due to difficult stick.  Oriented to room, safety precautions.

## 2016-08-05 NOTE — Evaluation (Signed)
Occupational Therapy Evaluation and Discharge Patient Details Name: Cody Parks MRN: [id number] DOB: 07/12/1970 Today's Date: 08/05/2016    History of Present Illness 46 y.o. male who [facility] with acute onset of left sided weakness and numbness, in conjunction with left eye pain which was exacerbated by looking towards the left. Also with left facial droop and dysarthria, as well as "difficulty seeing to the left". [facility]. [facility] and [facility]. MRI negative for acute infarct.   Clinical Impression   Pt independent with ADL PTA. Currently requiring min guard-min assist due to lack of effort during functional tasks. Pt with inconsistencies in report of visual deficits, functional limitations, and UB strength/coordination exam. Prior to entry to room pt observed to have full ROM and strength in L UE, however, with formal testing pt presenting with inconsistent weakness. Given the inconsistencies and contradicting findings with functional tasks vs formal testing; do not feel pt is appropriate for acute OT at this time. Please re-consult if needs change. Thank you for this referral.    Follow Up Recommendations  No OT follow up    Equipment Recommendations  None recommended by OT    Recommendations for Other Services       Precautions / Restrictions Precautions Precaution Comments: corrections officers present, cuffs applied Restrictions Weight Bearing Restrictions: No      Mobility Bed Mobility Overal bed mobility: Modified Independent             General bed mobility comments: increased time and effort, max cues to perform tasks, no physical assist required  Transfers Overall transfer level: Needs assistance Equipment used: None Transfers: Sit to/from Stand Sit to Stand: Min guard         General transfer comment: min guard for safety, no physical assist required.  patient with some instability upon standing reports he feels he is listing to the right side.     Balance Overall balance assessment: Needs assistance Sitting-balance support: Feet supported Sitting balance-Leahy Scale: Good     Standing balance support: No upper extremity supported Standing balance-Leahy Scale: Fair               High level balance activites: Turns High Level Balance Comments: poor effort to maintain balance during turns           ADL either performed or assessed with clinical judgement   ADL Overall ADL's : Needs assistance/impaired Eating/Feeding: Set up;Sitting                   Lower Body Dressing: Minimal assistance;Sit to/from stand Lower Body Dressing Details (indicate cue type and reason): Pt able to don R sock but getting frustrated with L due to lines and L UE/LE deficits. Refusing to don sock rest of the way so provided assist. Toilet Transfer: Min guard;Ambulation Toilet Transfer Details (indicate cue type and reason): Pt with inconsistencies during functional mobility in room; "limping" at times with bil LEs.         Functional mobility during ADLs: Min guard       Vision Baseline Vision/History: Wears glasses Wears Glasses: Reading only Patient Visual Report: Blurring of vision Vision Assessment?: Yes Additional Comments: Pt keeping L eye closed throughout. He reports that he only has blurred vision in L eye, eyesight is clear with binocular vision. Inconsistent report of ability to read and see objects in L visual field.  Perception     Praxis      Pertinent Vitals/Pain Pain Assessment: Faces Faces Pain Scale: Hurts even more Pain Location: L eye Pain Descriptors / Indicators: Discomfort Pain Intervention(s): Monitored during session     Hand Dominance Right   Extremity/Trunk Assessment Upper Extremity Assessment Upper Extremity Assessment: RUE deficits/detail;LUE deficits/detail RUE Deficits / Details:  inconsistencies with pt effort regarding ROM and MMT.  LUE Deficits / Details: inconsistencies with pt effort regarding ROM and MMT.  LUE Sensation: decreased light touch LUE Coordination: decreased fine motor   Lower Extremity Assessment Lower Extremity Assessment: Defer to PT evaluation RLE Deficits / Details: inconsistent effort with assessment, at times shows full strength and ROM, other times demostrates deficits fluctuation left side to right side RLE Coordination: decreased gross motor (during gait attempt, but states no issues with RLE) LLE Deficits / Details: inconsistent effort with assessment, at times shows full strength and ROM, other times demostrates deficits fluctuation left side to right side LLE Sensation: decreased light touch LLE Coordination: decreased fine motor;decreased gross motor (inconsistent effort and findings)   Cervical / Trunk Assessment Cervical / Trunk Assessment: Normal   Communication Communication Communication: No difficulties   Cognition Arousal/Alertness: Awake/alert Behavior During Therapy: Flat affect;Impulsive Overall Cognitive Status: Impaired/Different from baseline Area of Impairment: Safety/judgement                               General Comments: patient with poor effort and inconsistency throughout examination, some agitation when being asked to perform tasks    General Comments       Exercises     Shoulder Instructions      Home Living Family/patient expects to be discharged to:: [facility]     Type of Home: Other(Comment) (incarcerated)                                  Prior Functioning/Environment Level of Independence: Independent                 OT Problem List:        OT Treatment/Interventions:      OT Goals(Current goals can be found in the care plan section) Acute Rehab OT Goals Patient Stated Goal: none stated OT Goal Formulation: All assessment and education  complete, DC therapy  OT Frequency:     Barriers to D/C:            Co-evaluation PT/OT/SLP Co-Evaluation/Treatment: Yes Reason for Co-Treatment: To address functional/ADL transfers   OT goals addressed during session: ADL's and self-care      AM-PAC PT "6 Clicks" Daily Activity     Outcome Measure Help from another person eating meals?: None Help from another person taking care of personal grooming?: A Little Help from another person toileting, which includes using toliet, bedpan, or urinal?: A Little Help from another person bathing (including washing, rinsing, drying)?: A Little Help from another person to put on and taking off regular upper body clothing?: A Little Help from another person to put on and taking off regular lower body clothing?: A Little 6 Click Score: 19   End of Session Nurse Communication: Mobility status (inconsistencies with deficits noted)  Activity Tolerance: Patient tolerated treatment well Patient left: in bed;with call bell/phone within reach;Other (comment) (with corrections officers-reappling handcuffs)  OT Visit Diagnosis: Other abnormalities of gait and mobility (R26.89);Muscle weakness (generalized) (M62.81)  Time: 4098-1191 OT Time Calculation (min): 15 min Charges:  OT General Charges $OT Visit: 1 Procedure OT Evaluation $OT Eval Moderate Complexity: 1 Procedure G-Codes:     Laura Radilla A. Brett Albino, M.S., OTR/L Pager: [phone]  Gaye Alken 08/05/2016, 2:04 PM

## 2016-08-06 ENCOUNTER — Inpatient Hospital Stay (HOSPITAL_COMMUNITY)

## 2016-08-06 ENCOUNTER — Encounter (HOSPITAL_COMMUNITY): Payer: Self-pay | Admitting: *Deleted

## 2016-08-06 DIAGNOSIS — M5481 Occipital neuralgia: Secondary | ICD-10-CM

## 2016-08-06 DIAGNOSIS — I639 Cerebral infarction, unspecified: Secondary | ICD-10-CM

## 2016-08-06 DIAGNOSIS — F449 Dissociative and conversion disorder, unspecified: Principal | ICD-10-CM

## 2016-08-06 LAB — HEMOGLOBIN A1C
Hgb A1c MFr Bld: 5.3 % (ref 4.8–5.6)
MEAN PLASMA GLUCOSE: 105 mg/dL

## 2016-08-06 LAB — ECHOCARDIOGRAM COMPLETE
HEIGHTINCHES: 69 in
WEIGHTICAEL: 2673.6 [oz_av]
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
## 2016-08-06 NOTE — Progress Notes (Signed)
Pt called to use the bathroom; RN went in to assist the pt. Pt was offered urinal but he refused and told RN he wants to go in to the bathroom because he has to have a BM. Pt had his left hand closed that he can't function with it; pt was able to sit on the side of the bed himself and RN and prison Guard assisted him to stand and ambulate to the BR holding him on each side. After 5 steps RN felt pts leg buckling and going down; RN called for help and guard help assist him to the floor. Another RN came in and pt stated "If you both will help me I can stand up". Along with the other nurse pt assisted standing up and back to sit on the bed. BSC was used and after straining several times pt was unable to have the BM. Prn medication and prune juice given to pt. Pt advised to use BSC and urinal. Vitals taken, MD notified; Charge RN notified and pt transferred to camera room for better observation. Prison Guards aware and agree. Pt denies any injury or pain except his chronic left eye pain which he started it still hurt. No injury or new bruising noted. Will continue to closely monitor pt. Dionne BucyP. Amo Demitri Kucinski RN

## 2016-08-06 NOTE — Progress Notes (Signed)
Pt noted moving the side table and feeding himself with his left hand when his lunch tray arrived. But, pt RN went in to the room pt report tingling and slight movement to fingers and hand. When pt was reminded he's in a camera room and can be watched pt noted not to move LUE anymore. Will continue to closely monitor. Dionne BucyP. Amo Laquasha Groome RN

## 2016-08-06 NOTE — Progress Notes (Signed)
  Date: 08/06/2016  Patient name: Cody Parks  Medical record number: [id number]  Date of birth: [birth year]   I have seen and evaluated this patient and I have discussed the plan of care with the house staff. Please see Dr. Eliane DecreePatel's upcoming note for complete details.   Reviewed nursing notes and patients exam has been noted to be very inconsistent.  For the eye pain, we are treating as zoster ophthalmicus and have requested ophthalmology evaluate him.  Continued monitoring in SDU today and likely transfer to floor tomorrow.    Inez CatalinaMullen, Cody Ghuman B, MD 08/06/2016, 2:17 PM

## 2016-08-06 NOTE — Progress Notes (Signed)
STROKE TEAM PROGRESS NOTE   SUBJECTIVE (INTERVAL HISTORY) Two corrections officers are in the room. Pt still complains of pain at back of his left eye. Found to have left occipital neuralgia. However, pt still complain of left UE and LE weakness, but exam showed functional component. Pt admitted that he has a lot of stress lately (about to move to another facility, mom visits to facility) and he was about to be released in 2.5 months.    OBJECTIVE Temp:  [97.8 F (36.6 C)-98.7 F (37.1 C)] 97.8 F (36.6 C) (06/03 2048) Pulse Rate:  [69-84] 74 (06/03 2048) Cardiac Rhythm: Normal sinus rhythm (06/03 0848) Resp:  [16-20] 16 (06/03 2048) BP: (127-170)/(96-109) 138/103 (06/03 2048) SpO2:  [97 %-100 %] 100 % (06/03 2048)  CBC:   Recent Labs Lab 08/04/16 1406 08/05/16 0932  WBC 8.6 12.1*  NEUTROABS 6.2  --   HGB 14.1 14.6  HCT 41.6 44.3  MCV 93.3 93.9  PLT 189 186    Basic Metabolic Panel:   Recent Labs Lab 08/04/16 1406  NA 139  K 4.6  CL 106  CO2 26  GLUCOSE 90  BUN 12  CREATININE 0.99  CALCIUM 10.1    Lipid Panel:     Component Value Date/Time   CHOL 169 08/05/2016 0550   TRIG 88 08/05/2016 0550   HDL 45 08/05/2016 0550   CHOLHDL 3.8 08/05/2016 0550   VLDL 18 08/05/2016 0550   LDLCALC 106 (H) 08/05/2016 0550   HgbA1c:  Lab Results  Component Value Date   HGBA1C 5.3 08/05/2016   Urine Drug Screen: No results found for: LABOPIA, COCAINSCRNUR, LABBENZ, AMPHETMU, THCU, LABBARB  Alcohol Level No results found for: [facility]  IMAGING I have personally reviewed the radiological images below and agree with the radiology interpretations.  Mr Maxine GlennMra Head Wo Contrast 08/04/2016   1. Limited MRI of the brain without evidence of acute infarct.  2. Abnormal left side vessel findings on intracranial MRA which may be artifactual due to susceptibility (which also limits visualization of the inferior left cerebellum on diffusion). Still, follow-up CTA head and neck would be  necessary to confirm normal patency of the left vertebral artery, the left ICA, and the left ACA.   Mr Brain Wo Contrast 08/05/2016 Normal brain MRI.   Ct Head Code Stroke W/o Cm 08/04/2016 1. Normal CT appearance of the brain  2. ASPECTS is 10/10   Transthoracic Echocardiogram 08/06/2016 - Left ventricle: The cavity size was normal. Wall thickness was   normal. Systolic function was normal. The estimated ejection   fraction was in the range of 55% to 60%. Wall motion was normal;   there were no regional wall motion abnormalities. There was an   increased relative contribution of atrial contraction to   ventricular filling, which may be due to hypovolemia. Left   ventricular diastolic function parameters were normal.  CUS - pending    PHYSICAL EXAM  Temp:  [97.8 F (36.6 C)-98.7 F (37.1 C)] 97.8 F (36.6 C) (06/03 2048) Pulse Rate:  [69-84] 74 (06/03 2048) Resp:  [16-20] 16 (06/03 2048) BP: (127-170)/(96-109) 138/103 (06/03 2048) SpO2:  [97 %-100 %] 100 % (06/03 2048)  General - Well nourished, well developed, complains of pain behind left eye.  Ophthalmologic - Fundi not visualized due to photophobia.  Cardiovascular - Regular rate and rhythm.  Skull - left lesser and greater occipital nerve tenderness on palpitation   Mental Status -  Level of arousal and orientation to time,  place, and person were intact. Language including expression, naming, repetition, comprehension was assessed and found intact. Fund of Knowledge was assessed and was intact.  Cranial Nerves II - XII - II - Visual field intact OU. III, IV, VI - Extraocular movements intact. Left mid ptosis (? Baseline), PERRL, EOMI. V - Facial sensation subjectively decreased on the left, however, tuning fork test positive VII - Facial movement, pt intentionally raise up right lip only.  VIII - Hearing & vestibular intact bilaterally. X - Palate elevates symmetrically. XI - Chin turning & shoulder shrug  intact bilaterally. XII - Tongue protrusion intact.  Motor Strength - The patient's strength was normal in RUE and RLE, however significant giveaway weakness at LUE and LLE, hoover sign positive. Bulk was normal and fasciculations were absent.   Motor Tone - Muscle tone was assessed at the neck and appendages and was normal.  Reflexes - The patient's reflexes were 1+ in all extremities and he had no pathological reflexes.  Sensory - Light touch, temperature/pinprick and were subjectively decreased on the left.    Coordination - The patient had normal movements in the right hand with no ataxia or dysmetria.  Tremor was absent.  Gait and Station - deferred due to handcuff and foot cuff.    ASSESSMENT/PLAN Mr. Jerrett Baldinger is a 46 y.o. male with history of recent herpes zoster of the face, migraine headaches and a head injury approximately 20 years ago, presenting with acute onset of left sided weakness, left eye pain and left sided visual blurring.   He received IV TPA at [facility] Friday, 08/04/2016 at 1429.  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   Resultant subjective left hemiparesis  CT head - normal  MRI head - normal  MRA head - abnormal left sided vessels ?artifact?  TCDs - pending  Carotid Doppler - pending  2D Echo - EF 55-60%. No cardiac source of emboli identified.  LDL - 106  HgbA1c - 5.3  VTE prophylaxis - Lovenox Diet regular Room service appropriate? Yes; Fluid consistency: Thin  No antithrombotic prior to admission, now on ASA 81mg   Patient counseled to be compliant with his antithrombotic medications  Ongoing aggressive stroke risk factor management  Therapy recommendations: No follow-up PT or speech therapy recommended.  Disposition:  Pending  Left occipital neuralgia  Likely the cause of his left sided HA and pain at the back of left eye  Consider occipital nerve block if not improving  Recent  left facial shingle  With left sided pain, photophobia - concerning for VZV meningitis  However, no fever or leukocytosis, MRI negative, not supportive  Will continue valacyclovir and monitoring  LP if needed  Hyperlipidemia  Home meds: No lipid lowering medications prior to admission.  LDL 106, goal < 70  Add Lipitor 20 mg daily.  Continue statin at discharge  Other Stroke Risk Factors  ETOH use, advised to drink no more than 1 drink per day.  Migraines  Other Active Problems  Contrast allergy - patient refused any imaging involving contrast.  Recent stress in prison   Hospital day # 3  Marvel Plan, MD PhD Stroke Neurology 08/07/2016 12:06 AM   To contact Stroke Continuity provider, please refer to [email or web address]. After hours, contact General Neurology

## 2016-08-07 ENCOUNTER — Inpatient Hospital Stay (HOSPITAL_COMMUNITY)

## 2016-08-07 DIAGNOSIS — M5481 Occipital neuralgia: Secondary | ICD-10-CM

## 2016-08-07 DIAGNOSIS — R1084 Generalized abdominal pain: Secondary | ICD-10-CM

## 2016-08-07 DIAGNOSIS — I639 Cerebral infarction, unspecified: Secondary | ICD-10-CM

## 2016-08-07 DIAGNOSIS — I10 Essential (primary) hypertension: Secondary | ICD-10-CM

## 2016-08-07 DIAGNOSIS — Z8619 Personal history of other infectious and parasitic diseases: Secondary | ICD-10-CM

## 2016-08-07 DIAGNOSIS — F449 Dissociative and conversion disorder, unspecified: Secondary | ICD-10-CM

## 2016-08-07 LAB — COMPREHENSIVE METABOLIC PANEL
ALBUMIN: 3.9 g/dL (ref 3.5–5.0)
ALT: 15 U/L — ABNORMAL LOW (ref 17–63)
AST: 22 U/L (ref 15–41)
Alkaline Phosphatase: 52 U/L (ref 38–126)
Anion gap: 8 (ref 5–15)
BILIRUBIN TOTAL: 0.9 mg/dL (ref 0.3–1.2)
BUN: 11 mg/dL (ref 6–20)
CHLORIDE: 104 mmol/L (ref 101–111)
CO2: 27 mmol/L (ref 22–32)
Calcium: 9 mg/dL (ref 8.9–10.3)
Creatinine, Ser: 1.07 mg/dL (ref 0.61–1.24)
GFR calc Af Amer: >60 mL/min (ref >60)
GFR calc non Af Amer: >60 mL/min (ref >60)
GLUCOSE: 105 mg/dL — AB (ref 65–99)
POTASSIUM: 4 mmol/L (ref 3.5–5.1)
Sodium: 139 mmol/L (ref 135–145)
TOTAL PROTEIN: 6.4 g/dL — AB (ref 6.5–8.1)

## 2016-08-07 LAB — CBC
HEMATOCRIT: 43.6 % (ref 39.0–52.0)
HEMOGLOBIN: 14.8 g/dL (ref 13.0–17.0)
MCH: 31.6 pg (ref 26.0–34.0)
MCHC: 33.9 g/dL (ref 30.0–36.0)
MCV: 93.2 fL (ref 78.0–100.0)
Platelets: 253 10*3/uL (ref 150–400)
RBC: 4.68 MIL/uL (ref 4.22–5.81)
RDW: 13.8 % (ref 11.5–15.5)
WBC: 10.2 10*3/uL (ref 4.0–10.5)

## 2016-08-07 LAB — VAS US CAROTID
LCCADDIAS: 32 cm/s
LCCADSYS: 67 cm/s
LEFT ECA DIAS: -17 cm/s
LEFT VERTEBRAL DIAS: -15 cm/s
LICADSYS: -51 cm/s
Left CCA prox dias: 28 cm/s
Left CCA prox sys: 97 cm/s
Left ICA dist dias: -20 cm/s
Left ICA prox dias: -17 cm/s
Left ICA prox sys: -57 cm/s
RCCADSYS: -76 cm/s
RCCAPSYS: 81 cm/s
RIGHT ECA DIAS: -22 cm/s
RIGHT VERTEBRAL DIAS: 13 cm/s
Right CCA prox dias: 22 cm/s

## 2016-08-07 LAB — LIPASE, BLOOD: Lipase: 24 U/L (ref 11–51)

## 2016-08-07 MED ORDER — GABAPENTIN 400 MG PO CAPS
400.0000 mg | ORAL_CAPSULE | Freq: Three times a day (TID) | ORAL | Status: DC
  Administered 2016-08-07 – 2016-08-08 (×2): 400 mg via ORAL
  Filled 2016-08-07 (×2): qty 1

## 2016-08-07 MED ORDER — ATORVASTATIN CALCIUM 10 MG PO TABS
20.0000 mg | ORAL_TABLET | Freq: Every day | ORAL | Status: DC
  Administered 2016-08-07: 20 mg via ORAL
  Filled 2016-08-07: qty 2

## 2016-08-07 MED ORDER — ONDANSETRON HCL 4 MG/2ML IJ SOLN
4.0000 mg | Freq: Once | INTRAMUSCULAR | Status: AC
  Administered 2016-08-07: 4 mg via INTRAVENOUS

## 2016-08-07 MED ORDER — ONDANSETRON HCL 4 MG/2ML IJ SOLN
4.0000 mg | Freq: Four times a day (QID) | INTRAMUSCULAR | Status: DC | PRN
  Filled 2016-08-07: qty 2

## 2016-08-07 MED ORDER — BARIUM SULFATE 2.1 % PO SUSP
ORAL | Status: AC
  Administered 2016-08-07: 14:00:00
  Filled 2016-08-07: qty 2

## 2016-08-07 MED ORDER — KETOROLAC TROMETHAMINE 60 MG/2ML IM SOLN
60.0000 mg | Freq: Once | INTRAMUSCULAR | Status: DC
  Filled 2016-08-07: qty 2

## 2016-08-07 MED ORDER — HYDROMORPHONE HCL 1 MG/ML IJ SOLN
0.5000 mg | Freq: Once | INTRAMUSCULAR | Status: AC
  Administered 2016-08-07: 0.5 mg via INTRAVENOUS
  Filled 2016-08-07: qty 0.5

## 2016-08-07 MED ORDER — ONDANSETRON HCL 4 MG/2ML IJ SOLN
INTRAMUSCULAR | Status: AC
  Administered 2016-08-07: 4 mg
  Filled 2016-08-07: qty 2

## 2016-08-07 NOTE — Progress Notes (Signed)
Preliminary results by tech - Carotid Duplex Completed. No evidence of a significant stenosis in bilateral carotid arteries. Vertebral arteries demonstrated antegrade flow. Sunaina Ferrando, BS, RDMS, RVT  

## 2016-08-07 NOTE — Evaluation (Signed)
Occupational Therapy Re-Evaluation Patient Details Name: Cody Parks MRN: [id number] DOB: 1970/03/08 Today's Date: 08/07/2016    History of Present Illness 46 y.o. male who presented to [facility] with acute onset of left sided weakness and numbness, in conjunction with left eye pain which was exacerbated by looking towards the left. Also with left facial droop and dysarthria, as well as "difficulty seeing to the left". Initial NIHSS at [facility] was 10. He received IV tPA at [facility] and was emergently transported to [facility] for possible thrombectomy. MRI negative for acute infarct.   Clinical Impression   Pt admitted with above. He was seen with PT.   Pt requires min - mod A for ADLs and mod - max A for functional mobility.  He continues with inconsistent responses during eval.  He reports he can't move his Lt UE, but does demonstrates full ROM Lt UE, but moves it slowly, deliberately, and rigidly.  He demonstrates heavy Rt lateral lean during functional mobility, and when challenged does not initiate a balance response placing him at very high risk for falls.   His performance was variable and he fluctuated between Rt and Lt sided difficulty/deficit.   Feel he will need continued follow up therapies, both PT and OT when discharged, and may do best using a wheelchair for generalu mobility until he begins to initiate balance responses with therapies to prevent injury.   Per MD, pt will hopefully discharge today.  Therefore, will defer further OT and sign off.     Follow Up Recommendations  Other (comment) (follow up OT at prison )    Equipment Recommendations  3 in 1 bedside commode;Wheelchair (measurements OT)    Recommendations for Other Services       Precautions / Restrictions Precautions Precautions: Fall Precaution Comments: Pt is high fall risk as he does not initiate balance reactions when off his BOS  Restrictions Weight Bearing Restrictions: No      Mobility Bed Mobility Overal bed mobility:  Needs Assistance Bed Mobility: Supine to Sit;Sit to Supine     Supine to sit: Min assist Sit to supine: Min assist   General bed mobility comments: increased time and min A to lift trunk and assist to lift Lt LE   Transfers Overall transfer level: Needs assistance Equipment used: Rolling walker (2 wheeled);2 person hand held assist Transfers: Sit to/from [employer] Sit to Stand: Min assist;+2 physical assistance Stand pivot transfers: Mod assist;Min assist;+2 physical assistance       General transfer comment: Pt unsteady with heavy Lt lean requiring assist to prevent fall     Balance Overall balance assessment: Needs assistance Sitting-balance support: Feet supported Sitting balance-Leahy Scale: Good     Standing balance support: Bilateral upper extremity supported Standing balance-Leahy Scale: Poor Standing balance comment: Pt required min guard - min A for static standing - min guard assist when distracted with conversatio;  and mod - max A for dynamic standing.  When shifted off his BOS, he leans heavily to Rt making no attempt to correct and requiring max A +2 to prevent fall.  Worked with pt on weight shifting onto Lt and Rt LEs, Lt LE buckles when pt attempts to unweight Rt LE requiring max A +2 to prevent fall                            ADL either performed or assessed with clinical judgement   ADL Overall ADL's : Needs assistance/impaired  Grooming: Oral care;Minimal assistance;Standing Grooming Details (indicate cue type and reason): min A for balance              Lower Body Dressing: Moderate assistance;+2 for physical assistance;Sit to/from stand Lower Body Dressing Details (indicate cue type and reason): assist for balance and to pull socks and pants over feet  Toilet Transfer: Moderate assistance;+2 for physical assistance;Ambulation;Comfort height toilet;RW;Grab bars   Toileting- Clothing Manipulation and Hygiene: Moderate  assistance;Sit to/from stand;+2 for safety/equipment       Functional mobility during ADLs: Moderate assistance;+2 for physical assistance;Rolling walker General ADL Comments: Pt leans heavily to Rt and does not initiate balance reactions to correct      Vision Baseline Vision/History: Wears glasses Additional Comments: did not formally assess.  He was able to locate needed items without cues      Perception Perception Perception Tested?: Yes   Praxis Praxis Praxis tested?: Within functional limits    Pertinent Vitals/Pain Pain Assessment: Faces Faces Pain Scale: Hurts even more Pain Location: L eye Pain Descriptors / Indicators: Discomfort Pain Intervention(s): Monitored during session     Hand Dominance Right   Extremity/Trunk Assessment Upper Extremity Assessment Upper Extremity Assessment: RUE deficits/detail;LUE deficits/detail RUE Deficits / Details: Pt able to use Rt UE to brush teeth, but holds it in flexion much of time  LUE Deficits / Details: Pt states he can't move Lt UE, and hold Lt UE in flexed posture with hand fisted.   He will use pincer grasp to open toothpaste tube, but will not functionally use it otherwise, initially.  When cued, he will extend Lt UE fully, and open hand very slowly and deliberately, hold Lt UE rigidly.   He initially required AAROM for full shoulder ROM, but then was able to perform full ROM, but moves rigidly, slowly, and deliberately.   LUE Sensation: decreased light touch LUE Coordination: decreased fine motor;decreased gross motor   Lower Extremity Assessment Lower Extremity Assessment: Defer to PT evaluation       Communication Communication Communication: No difficulties   Cognition Arousal/Alertness: Awake/alert Behavior During Therapy: Anxious;Flat affect Overall Cognitive Status: Within Functional Limits for tasks assessed                                     General Comments       Exercises Exercises:  Other exercises Other Exercises Other Exercises: Pt instructed to perform AROM lt shoulder flex and finger flex/ext as well as heel slides 10x/hour.      Shoulder Instructions      Home Living Family/patient expects to be discharged to:: Dentention/Prison                                        Prior Functioning/Environment Level of Independence: Independent                 OT Problem List: Decreased strength;Decreased range of motion;Decreased activity tolerance;Impaired balance (sitting and/or standing);Impaired vision/perception;Decreased coordination;Decreased safety awareness;Decreased knowledge of use of DME or AE;Impaired sensation;Impaired UE functional use;Pain      OT Treatment/Interventions:      OT Goals(Current goals can be found in the care plan section) Acute Rehab OT Goals Patient Stated Goal: to get better  OT Goal Formulation: All assessment and education complete, DC therapy  OT Frequency:  Barriers to D/C:            Co-evaluation PT/OT/SLP Co-Evaluation/Treatment: Yes Reason for Co-Treatment: For patient/therapist safety;To address functional/ADL transfers   OT goals addressed during session: ADL's and self-care;Strengthening/ROM      AM-PAC PT "6 Clicks" Daily Activity     Outcome Measure Help from another person eating meals?: None Help from another person taking care of personal grooming?: A Little Help from another person toileting, which includes using toliet, bedpan, or urinal?: A Lot Help from another person bathing (including washing, rinsing, drying)?: A Lot Help from another person to put on and taking off regular upper body clothing?: A Lot Help from another person to put on and taking off regular lower body clothing?: A Lot 6 Click Score: 15   End of Session Equipment Utilized During Treatment: Gait belt;Rolling walker Nurse Communication: Mobility status  Activity Tolerance: Patient tolerated treatment  well Patient left: in bed;with call bell/phone within reach;Other (comment) (guards )  OT Visit Diagnosis: Unsteadiness on feet (R26.81);Muscle weakness (generalized) (M62.81);Hemiplegia and hemiparesis Hemiplegia - Right/Left: Left Hemiplegia - caused by: Unspecified                Time: [id number] OT Time Calculation (min): 29 min Charges:  OT General Charges $OT Visit: 1 Procedure OT Evaluation $OT Re-eval: 1 Procedure G-Codes:     Jeani HawkingWendi Dilpreet Faires, OTR/L [phone]   Jeani Hawkingonarpe, Ilma Achee M 08/07/2016, 12:32 PM

## 2016-08-07 NOTE — Progress Notes (Signed)
STROKE TEAM PROGRESS NOTE   SUBJECTIVE (INTERVAL HISTORY) Two corrections officers are in the room. Pt complains of sudden onset abdominal pain after eating and with nausea and vomiting. He has lost his IV and RN is trying to establish IV for him. His pain at back of his eye and his occipital neuralgia at left seems improved from yesterday. He is able to walk with walker under assistance from PT.     OBJECTIVE Temp:  [97.6 F (36.4 C)-98.7 F (37.1 C)] 98.5 F (36.9 C) (06/04 1337) Pulse Rate:  [56-115] 115 (06/04 1337) Resp:  [16-20] 20 (06/04 1337) BP: (131-164)/(60-103) 132/93 (06/04 1337) SpO2:  [93 %-100 %] 93 % (06/04 1337)  CBC:   Recent Labs Lab 08/04/16 1406 08/05/16 0932  WBC 8.6 12.1*  NEUTROABS 6.2  --   HGB 14.1 14.6  HCT 41.6 44.3  MCV 93.3 93.9  PLT 189 186    Basic Metabolic Panel:   Recent Labs Lab 08/04/16 1406  NA 139  K 4.6  CL 106  CO2 26  GLUCOSE 90  BUN 12  CREATININE 0.99  CALCIUM 10.1    Lipid Panel:     Component Value Date/Time   CHOL 169 08/05/2016 0550   TRIG 88 08/05/2016 0550   HDL 45 08/05/2016 0550   CHOLHDL 3.8 08/05/2016 0550   VLDL 18 08/05/2016 0550   LDLCALC 106 (H) 08/05/2016 0550   HgbA1c:  Lab Results  Component Value Date   HGBA1C 5.3 08/05/2016   Urine Drug Screen: No results found for: LABOPIA, COCAINSCRNUR, LABBENZ, AMPHETMU, THCU, LABBARB  Alcohol Level No results found for: [facility]  IMAGING I have personally reviewed the radiological images below and agree with the radiology interpretations.  Mr Maxine Glenn Head Wo Contrast 08/04/2016   1. Limited MRI of the brain without evidence of acute infarct.  2. Abnormal left side vessel findings on intracranial MRA which may be artifactual due to susceptibility (which also limits visualization of the inferior left cerebellum on diffusion). Still, follow-up CTA head and neck would be necessary to confirm normal patency of the left vertebral artery, the left ICA, and the left  ACA.   Mr Brain Wo Contrast 08/05/2016 Normal brain MRI.   Ct Head Code Stroke W/o Cm 08/04/2016 1. Normal CT appearance of the brain  2. ASPECTS is 10/10   Transthoracic Echocardiogram 08/06/2016 - Left ventricle: The cavity size was normal. Wall thickness was   normal. Systolic function was normal. The estimated ejection   fraction was in the range of 55% to 60%. Wall motion was normal;   there were no regional wall motion abnormalities. There was an   increased relative contribution of atrial contraction to   ventricular filling, which may be due to hypovolemia. Left   ventricular diastolic function parameters were normal.  Carotid Duplex - No evidence of a significant stenosis in bilateral carotid arteries. Vertebral arteries demonstrated antegrade flow.     PHYSICAL EXAM  Temp:  [97.6 F (36.4 C)-98.7 F (37.1 C)] 98.5 F (36.9 C) (06/04 1337) Pulse Rate:  [56-115] 115 (06/04 1337) Resp:  [16-20] 20 (06/04 1337) BP: (131-164)/(60-103) 132/93 (06/04 1337) SpO2:  [93 %-100 %] 93 % (06/04 1337)  General - Well nourished, well developed, acute distress due to abdominal pain.  Ophthalmologic - Fundi not visualized due to photophobia.  Cardiovascular - Regular rate and rhythm.  Abdomen -  Tenderness on palpation.   Skull - left lesser and greater occipital nerve tenderness on palpitation, much  improved from yesterday   Mental Status -  Level of arousal and orientation to time, place, and person were intact. Language including expression, naming, repetition, comprehension was assessed and found intact. Fund of Knowledge was assessed and was intact.  Cranial Nerves II - XII - II - Visual field intact OU. III, IV, VI - Extraocular movements intact.  V - Facial sensation subjectively decreased on the left, however, tuning fork test positive VII - Facial movement, pt intentionally raise up right lip only.  VIII - Hearing & vestibular intact bilaterally. X - Palate  elevates symmetrically. XI - Chin turning & shoulder shrug intact bilaterally. XII - Tongue protrusion intact.  Motor Strength - The patient's strength was normal in RUE and RLE, however significant giveaway weakness at LUE and LLE, hoover sign positive. Bulk was normal and fasciculations were absent.   Motor Tone - Muscle tone was assessed at the neck and appendages and was normal.  Reflexes - The patient's reflexes were 1+ in all extremities and he had no pathological reflexes.  Sensory - Light touch, temperature/pinprick and were subjectively decreased on the left.    Coordination - The patient had normal movements in the right hand with no ataxia or dysmetria.  Tremor was absent.  Gait and Station - deferred due to handcuff and foot cuff.    ASSESSMENT/PLAN [name] is a 46 y.o. male with history of recent herpes zoster of the face, migraine headaches and a head injury approximately 20 years ago, presenting with acute onset of left sided weakness, left eye pain and left sided visual blurring.   He received [facility] Friday, 08/04/2016 at 1429.  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   Resultant subjective left hemiparesis  CT head - normal  MRI head - normal  MRA head - abnormal left sided vessels most likely due to artifact  TCDs - pending  Carotid Doppler - unremarkable  2D Echo - EF 55-60%. No cardiac source of emboli identified.  LDL - 106  HgbA1c - 5.3  VTE prophylaxis - Lovenox Diet regular Room service appropriate? Yes; Fluid consistency: Thin  No antithrombotic prior to admission, now on ASA 81mg . However, ASA not indicated at this time from stroke standpoint. Will d/c   Patient counseled to be compliant with his antithrombotic medications  Ongoing aggressive stroke risk factor management  Therapy recommendations: No follow-up PT or speech therapy recommended.  Disposition:   Pending  Left occipital neuralgia  Likely the cause of his left sided HA and pain at the back of left eye  Much improved with conservative management.  Consider occipital nerve block if not improving  Recent left facial shingle  With left sided pain, photophobia - concerning for VZV meningitis  However, no fever or leukocytosis, MRI negative, not supportive  Will continue valacyclovir and monitoring  No indication for LP at this time  Hyperlipidemia  Home meds: No lipid lowering medications prior to admission.  LDL 106, goal < 70  Add Lipitor 20 mg daily.  Continue statin at discharge  Other Stroke Risk Factors  ETOH use, advised to drink no more than 1 drink per day.  Migraines  Other Active Problems  Contrast allergy - patient refused any imaging involving contrast.  Recent stress in prison   Hospital day # 3  Neurology will sign off. Please call with questions. No neurology follow up needed at this time. Thanks for the consult.  Marvel Plan, MD PhD Stroke Neurology 08/07/2016 1:47 PM   To contact Stroke Continuity provider, please refer to [email or web address]. After hours, contact General Neurology
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
## 2016-08-07 NOTE — Progress Notes (Signed)
Physical Therapy Treatment Patient Details Name: Cody Parks MRN: [id number] DOB: [birth year] Today's Date: 08/07/2016    History of Present Illness 46 y.o. male who presented to [facility] with acute onset of left sided weakness and numbness, in conjunction with left eye pain which was exacerbated by looking towards the left. Also with left facial droop and dysarthria, as well as "difficulty seeing to the left". Initial NIHSS at [facility] was 10. He received IV tPA at [facility] and was emergently transported to [facility] for possible thrombectomy. MRI negative for acute infarct.    PT Comments    Pt requiring increased assistance for mobility this session. At times, +2 mod-max assist was required for safe functional mobility. Heavy R lateral lean was noted during standing activity, and decreased balance response demonstrated with challenging tasks. Continued to note inconsistencies throughout session with deficits, and at times weakness appeared to be switch from L to R. Multimodal cues provided for safe walker management, however feel he did better with the RW at this time. Agree with OT's recommendation of wheelchair use for safety until he is able to progress more with therapy. Will continue to follow and progress as able per POC.   Follow Up Recommendations  Home health PT     Equipment Recommendations  Wheelchair; wheelchair cushion   Recommendations for Other Services       Precautions / Restrictions Precautions Precautions: Fall Precaution Comments: Pt is high fall risk as he does not initiate balance reactions when off his BOS  Restrictions Weight Bearing Restrictions: No    Mobility  Bed Mobility Overal bed mobility: Needs Assistance Bed Mobility: Supine to Sit;Sit to Supine     Supine to sit: Min assist Sit to supine: Min assist   General bed mobility comments: Increased time and min assist elevate trunk to full sitting position. Upon return to supine, assist required for LLE elevation up  into bed. Pt required step-by-step cues for sequencing and to maximize independence.  Transfers Overall transfer level: Needs assistance Equipment used: Rolling walker (2 wheeled);2 person hand held assist Transfers: Sit to/from [employer] Sit to Stand: Min assist;+2 physical assistance Stand pivot transfers: Mod assist;Min assist;+2 physical assistance       General transfer comment: Pt unsteady with heavy L-sided lean requiring assist to prevent fall   Ambulation/Gait Ambulation/Gait assistance: Mod assist;Max assist;+2 physical assistance Ambulation Distance (Feet): 20 Feet Assistive device: Rolling walker (2 wheeled);2 person hand held assist Gait Pattern/deviations: Narrow base of support;Staggering right;Staggering left;Step-to pattern;Decreased stride length;Shuffle Gait velocity: decreased Gait velocity interpretation: Below normal speed for age/gender General Gait Details: Pt ambulated in room only. Initially with bilateral HHA (hand on L and forearm/elbow on R). Pt then given the RW for support. Multimodal cues provided for safe use of walker, weight shifting, and advancing BLE's. Up to +2 max assist required to prevent a fall at times. Noted tremor in RLE, and an internally rotated ankle with decreased DF bilaterally during swing-through at times.    Stairs            Wheelchair Mobility    Modified Rankin (Stroke Patients Only) Modified Rankin (Stroke Patients Only) Pre-Morbid Rankin Score: No symptoms Modified Rankin: Moderately severe disability     Balance Overall balance assessment: Needs assistance Sitting-balance support: Feet supported Sitting balance-Leahy Scale: Good     Standing balance support: Bilateral upper extremity supported Standing balance-Leahy Scale: Poor Standing balance comment: Pt required min guard - min A for static standing - min guard assist  when distracted with conversation;  and mod - max A for dynamic standing.   When shifted off his BOS, he leans heavily to Rt making no attempt to correct and requiring max A +2 to prevent fall.  Worked with pt on weight shifting onto Lt and Rt LEs, Lt LE buckles when pt attempts to unweight Rt LE requiring max A +2 to prevent fall                             Cognition Arousal/Alertness: Awake/alert Behavior During Therapy: Anxious;Flat affect Overall Cognitive Status: Within Functional Limits for tasks assessed                                        Exercises Other Exercises Other Exercises: Pt instructed to perform AROM lt shoulder flex and finger flex/ext as well as heel slides 10x/hour.       General Comments        Pertinent Vitals/Pain Pain Assessment: Faces Faces Pain Scale: Hurts even more Pain Location: L eye Pain Descriptors / Indicators: Discomfort Pain Intervention(s): Monitored during session    Home Living Family/patient expects to be discharged to:: [facility]                    Prior Function Level of Independence: Independent          PT Goals (current goals can now be found in the care plan section) Acute Rehab PT Goals Patient Stated Goal: to get better  PT Goal Formulation: With patient Time For Goal Achievement: 08/19/16 Potential to Achieve Goals: Fair Progress towards PT goals: Progressing toward goals    Frequency    Min 3X/week      PT Plan Discharge plan needs to be updated    Co-evaluation PT/OT/SLP Co-Evaluation/Treatment: Yes Reason for Co-Treatment: For patient/therapist safety;To address functional/ADL transfers PT goals addressed during session: Mobility/safety with mobility;Balance;Proper use of DME;Strengthening/ROM OT goals addressed during session: ADL's and self-care;Strengthening/ROM      AM-PAC PT "6 Clicks" Daily Activity  Outcome Measure  Difficulty turning over in bed (including adjusting bedclothes, sheets and blankets)?: Total Difficulty moving  from lying on back to sitting on the side of the bed? : Total Difficulty sitting down on and standing up from a chair with arms (e.g., wheelchair, bedside commode, etc,.)?: Total Help needed moving to and from a bed to chair (including a wheelchair)?: A Lot Help needed walking in hospital room?: A Lot Help needed climbing 3-5 steps with a railing? : Total 6 Click Score: 8    End of Session Equipment Utilized During Treatment: Gait belt Activity Tolerance: Patient tolerated treatment well Patient left: in bed;with call bell/phone within reach Nurse Communication: Mobility status PT Visit Diagnosis: Other symptoms and signs involving the nervous system (N82.956(R29.898)     Time: [id number] PT Time Calculation (min) (ACUTE ONLY): 28 min  Charges:  $Gait Training: 8-22 mins                    G Codes:       Conni SlipperLaura Dimetri Armitage, PT, DPT Acute Rehabilitation Services Pager: [phone]    Marylynn PearsonLaura D Alcus Bradly 08/07/2016, 1:38 PM

## 2016-08-07 NOTE — Progress Notes (Signed)
Transcranial Doppler Completed. Cody Parks, BS, RDMS, RVT  

## 2016-08-07 NOTE — Progress Notes (Addendum)
Patient is vomiting-reports pain in lower right abdomen-states feels "jabbing" Patient is holding right lower abdomen as he moans. MD notified
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
## 2016-08-07 NOTE — Progress Notes (Signed)
Patient given READI-CAT to drink as ordered. Patient states "dont know if I can" He is encouraged to drink it. Will continue to monitor

## 2016-08-07 NOTE — Progress Notes (Signed)
Patient observed moving left arm and left leg, states that he feels some numbness.Patient drank and vomited contrast MD notified

## 2016-08-08 DIAGNOSIS — Z91013 Allergy to seafood: Secondary | ICD-10-CM

## 2016-08-08 DIAGNOSIS — Z8619 Personal history of other infectious and parasitic diseases: Secondary | ICD-10-CM

## 2016-08-08 DIAGNOSIS — Z888 Allergy status to other drugs, medicaments and biological substances status: Secondary | ICD-10-CM

## 2016-08-08 MED ORDER — GABAPENTIN 400 MG PO CAPS: 400.0000 mg | ORAL_CAPSULE | Freq: Three times a day (TID) | ORAL | 1 refills | Status: AC

## 2016-08-08 MED ORDER — VALACYCLOVIR HCL 1 G PO TABS
1000.0000 mg | ORAL_TABLET | Freq: Three times a day (TID) | ORAL | 0 refills | Status: AC
Start: 2016-08-08 — End: ?
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
## 2016-08-08 NOTE — Progress Notes (Signed)
[facility] are aware patient needs ongoing pt, wheelchair, and medications have been changed.

## 2016-08-08 NOTE — Care Management Note (Signed)
Case Management Note  Patient Details  Name: Cody Parks MRN: [id number] Date of Birth: February 06, 1971  Subjective/Objective:                    Action/Plan: [facility]. Pt to be transported via prison system. No further needs per CM.   Expected Discharge Date:  08/08/16               Expected Discharge Plan:  [facility]rections Facility  In-House Referral:     Discharge planning Services  CM Consult  Post Acute Care Choice:    Choice offered to:     DME Arranged:    DME Agency:     HH Arranged:    HH Agency:     Status of Service:  Completed, signed off  If discussed at [employer] Meetings, dates discussed:    Additional Comments:  Kermit BaloKelli F Dezra Mandella, RN 08/08/2016, 9:50 AM

## 2016-08-08 NOTE — Progress Notes (Signed)
[facility] guards in room stated patient was starting to get combative, asking when patient would be discharged. MD notified, patient discharged. RN discussed discharge instructions with patient, gave discharge AVS to prison guard. Patient verbalized understanding. IV removed.

## 2016-08-17 ENCOUNTER — Emergency Department (HOSPITAL_COMMUNITY)

## 2016-08-17 ENCOUNTER — Observation Stay (HOSPITAL_COMMUNITY)
Admission: EM | Admit: 2016-08-17 | Discharge: 2016-08-18 | Disposition: A | Discharge status: Home / Self Care | Attending: Student in an Organized Health Care Education/Training Program | Admitting: Student in an Organized Health Care Education/Training Program

## 2016-08-17 ENCOUNTER — Encounter (HOSPITAL_COMMUNITY): Payer: Self-pay

## 2016-08-17 DIAGNOSIS — Z888 Allergy status to other drugs, medicaments and biological substances status: Secondary | ICD-10-CM | POA: Insufficient documentation

## 2016-08-17 DIAGNOSIS — R531 Weakness: Principal | ICD-10-CM | POA: Insufficient documentation

## 2016-08-17 DIAGNOSIS — Z8619 Personal history of other infectious and parasitic diseases: Secondary | ICD-10-CM | POA: Diagnosis present

## 2016-08-17 DIAGNOSIS — F449 Dissociative and conversion disorder, unspecified: Secondary | ICD-10-CM | POA: Diagnosis not present

## 2016-08-17 DIAGNOSIS — R079 Chest pain, unspecified: Secondary | ICD-10-CM | POA: Diagnosis present

## 2016-08-17 DIAGNOSIS — Z91013 Allergy to seafood: Secondary | ICD-10-CM | POA: Insufficient documentation

## 2016-08-17 DIAGNOSIS — R4781 Slurred speech: Secondary | ICD-10-CM | POA: Diagnosis not present

## 2016-08-17 DIAGNOSIS — R29810 Facial weakness: Secondary | ICD-10-CM | POA: Insufficient documentation

## 2016-08-17 DIAGNOSIS — Z79899 Other long term (current) drug therapy: Secondary | ICD-10-CM

## 2016-08-17 DIAGNOSIS — G8194 Hemiplegia, unspecified affecting left nondominant side: Secondary | ICD-10-CM

## 2016-08-17 DIAGNOSIS — I639 Cerebral infarction, unspecified: Secondary | ICD-10-CM

## 2016-08-17 DIAGNOSIS — Z91041 Radiographic dye allergy status: Secondary | ICD-10-CM | POA: Insufficient documentation

## 2016-08-17 DIAGNOSIS — I10 Essential (primary) hypertension: Secondary | ICD-10-CM | POA: Diagnosis not present

## 2016-08-17 DIAGNOSIS — K5900 Constipation, unspecified: Secondary | ICD-10-CM

## 2016-08-17 DIAGNOSIS — B0229 Other postherpetic nervous system involvement: Secondary | ICD-10-CM | POA: Insufficient documentation

## 2016-08-17 DIAGNOSIS — R030 Elevated blood-pressure reading, without diagnosis of hypertension: Secondary | ICD-10-CM | POA: Diagnosis present

## 2016-08-17 DIAGNOSIS — R0789 Other chest pain: Secondary | ICD-10-CM | POA: Diagnosis not present

## 2016-08-17 LAB — COMPREHENSIVE METABOLIC PANEL
ALBUMIN: 4.3 g/dL (ref 3.5–5.0)
ALT: 16 U/L — AB (ref 17–63)
AST: 21 U/L (ref 15–41)
Alkaline Phosphatase: 48 U/L (ref 38–126)
Anion gap: 9 (ref 5–15)
BUN: 10 mg/dL (ref 6–20)
CHLORIDE: 105 mmol/L (ref 101–111)
CO2: 25 mmol/L (ref 22–32)
CREATININE: 1.1 mg/dL (ref 0.61–1.24)
Calcium: 9.3 mg/dL (ref 8.9–10.3)
GFR calc Af Amer: >60 mL/min (ref >60)
GFR calc non Af Amer: >60 mL/min (ref >60)
GLUCOSE: 87 mg/dL (ref 65–99)
Potassium: 4.2 mmol/L (ref 3.5–5.1)
SODIUM: 139 mmol/L (ref 135–145)
Total Bilirubin: 0.7 mg/dL (ref 0.3–1.2)
Total Protein: 6.8 g/dL (ref 6.5–8.1)

## 2016-08-17 LAB — I-STAT CHEM 8, ED
BUN: 11 mg/dL (ref 6–20)
CREATININE: 1 mg/dL (ref 0.61–1.24)
Calcium, Ion: 1.11 mmol/L — ABNORMAL LOW (ref 1.15–1.40)
Chloride: 104 mmol/L (ref 101–111)
Glucose, Bld: 87 mg/dL (ref 65–99)
HCT: 43 % (ref 39.0–52.0)
HEMOGLOBIN: 14.6 g/dL (ref 13.0–17.0)
POTASSIUM: 4.1 mmol/L (ref 3.5–5.1)
Sodium: 140 mmol/L (ref 135–145)
TCO2: 26 mmol/L (ref 0–100)

## 2016-08-17 LAB — CBG MONITORING, ED: Glucose-Capillary: 90 mg/dL (ref 65–99)

## 2016-08-17 LAB — CBC
HEMATOCRIT: 45.4 % (ref 39.0–52.0)
Hemoglobin: 14.6 g/dL (ref 13.0–17.0)
MCH: 30.9 pg (ref 26.0–34.0)
MCHC: 32.2 g/dL (ref 30.0–36.0)
MCV: 96 fL (ref 78.0–100.0)
PLATELETS: 206 10*3/uL (ref 150–400)
RBC: 4.73 MIL/uL (ref 4.22–5.81)
RDW: 14.2 % (ref 11.5–15.5)
WBC: 6.3 10*3/uL (ref 4.0–10.5)

## 2016-08-17 LAB — DIFFERENTIAL
BASOS ABS: 0 10*3/uL (ref 0.0–0.1)
BASOS PCT: 0 %
Eosinophils Absolute: 0.2 10*3/uL (ref 0.0–0.7)
Eosinophils Relative: 4 %
Lymphocytes Relative: 23 %
Lymphs Abs: 1.5 10*3/uL (ref 0.7–4.0)
MONOS PCT: 6 %
Monocytes Absolute: 0.4 10*3/uL (ref 0.1–1.0)
NEUTROS ABS: 4.2 10*3/uL (ref 1.7–7.7)
Neutrophils Relative %: 67 %

## 2016-08-17 LAB — APTT: APTT: 30 s (ref 24–36)

## 2016-08-17 LAB — PROTIME-INR
INR: 0.95
Prothrombin Time: 12.7 seconds (ref 11.4–15.2)

## 2016-08-17 LAB — I-STAT TROPONIN, ED: Troponin i, poc: 0 ng/mL (ref 0.00–0.08)

## 2016-08-17 LAB — TROPONIN I: Troponin I: <0.03 ng/mL (ref <0.03)

## 2016-08-17 MED ORDER — LABETALOL HCL 5 MG/ML IV SOLN
20.0000 mg | Freq: Once | INTRAVENOUS | Status: AC
  Administered 2016-08-17: 20 mg via INTRAVENOUS
  Filled 2016-08-17: qty 4

## 2016-08-17 MED ORDER — ACETAMINOPHEN 650 MG RE SUPP: 650.0000 mg | Freq: Four times a day (QID) | RECTAL | Status: DC | PRN

## 2016-08-17 MED ORDER — HYDROCORTISONE NA SUCCINATE PF 100 MG IJ SOLR
200.0000 mg | Freq: Once | INTRAMUSCULAR | Status: AC
  Administered 2016-08-17: 200 mg via INTRAVENOUS
  Filled 2016-08-17: qty 4

## 2016-08-17 MED ORDER — AMLODIPINE BESYLATE 5 MG PO TABS
10.0000 mg | ORAL_TABLET | Freq: Every day | ORAL | Status: DC
  Administered 2016-08-18: 10 mg via ORAL
  Filled 2016-08-17: qty 2

## 2016-08-17 MED ORDER — ONDANSETRON HCL 4 MG/2ML IJ SOLN
4.0000 mg | Freq: Once | INTRAMUSCULAR | Status: AC
  Administered 2016-08-17: 4 mg via INTRAVENOUS
  Filled 2016-08-17: qty 2

## 2016-08-17 MED ORDER — GABAPENTIN 400 MG PO CAPS
400.0000 mg | ORAL_CAPSULE | Freq: Three times a day (TID) | ORAL | Status: DC
  Administered 2016-08-17 – 2016-08-18 (×3): 400 mg via ORAL
  Filled 2016-08-17 (×3): qty 1

## 2016-08-17 MED ORDER — ENOXAPARIN SODIUM 40 MG/0.4ML ~~LOC~~ SOLN
40.0000 mg | SUBCUTANEOUS | Status: DC
  Administered 2016-08-17: 40 mg via SUBCUTANEOUS
  Filled 2016-08-17: qty 0.4

## 2016-08-17 MED ORDER — MORPHINE SULFATE (PF) 4 MG/ML IV SOLN
4.0000 mg | INTRAVENOUS | Status: DC | PRN
  Administered 2016-08-17: 4 mg via INTRAVENOUS
  Filled 2016-08-17: qty 1

## 2016-08-17 MED ORDER — ACETAMINOPHEN 325 MG PO TABS
650.0000 mg | ORAL_TABLET | Freq: Four times a day (QID) | ORAL | Status: DC | PRN
  Administered 2016-08-17: 650 mg via ORAL
  Filled 2016-08-17: qty 2

## 2016-08-17 MED ORDER — PANTOPRAZOLE SODIUM 40 MG PO TBEC
40.0000 mg | DELAYED_RELEASE_TABLET | Freq: Every day | ORAL | Status: DC
  Administered 2016-08-18: 40 mg via ORAL
  Filled 2016-08-17: qty 1

## 2016-08-17 MED ORDER — DICLOFENAC SODIUM 1 % TD GEL
4.0000 g | Freq: Four times a day (QID) | TRANSDERMAL | Status: DC
  Administered 2016-08-17: 4 g via TOPICAL
  Filled 2016-08-17: qty 100

## 2016-08-17 MED ORDER — SENNOSIDES-DOCUSATE SODIUM 8.6-50 MG PO TABS: 1.0000 | ORAL_TABLET | Freq: Every evening | ORAL | Status: DC | PRN
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
## 2016-08-17 NOTE — ED Notes (Signed)
Pt has returned from CT at this time, police escort. Pt in shackles
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
## 2016-08-17 NOTE — ED Triage Notes (Signed)
Pt presents via [facility] for evaluation of stroke like symptoms. Pt with L facial droop, L sided weakness, and slurred speech. LKW 1000. EMS reports pt was able to transfer from stretcher independently.
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
## 2016-08-17 NOTE — ED Notes (Signed)
Pt alert, responds appropriately, complains of Lt side weakness. No facial droop noted. MD aware about the pain. Waiting on CT

## 2016-08-17 NOTE — ED Notes (Signed)
Pt returned from CT °

## 2016-08-17 NOTE — ED Notes (Signed)
Pt reports epigastric pain and states it radiates to between his shoulder blades and describes it as "ripping" pain. Dr. Fayrene FearingJames informed. Pt in no current distress.

## 2016-08-17 NOTE — ED Notes (Signed)
MD notified about BP. Waiting on orders

## 2016-08-17 NOTE — ED Notes (Signed)
Pt transferred to CT.

## 2016-08-18 DIAGNOSIS — Z888 Allergy status to other drugs, medicaments and biological substances status: Secondary | ICD-10-CM | POA: Diagnosis not present

## 2016-08-18 DIAGNOSIS — Z91013 Allergy to seafood: Secondary | ICD-10-CM | POA: Diagnosis not present

## 2016-08-18 DIAGNOSIS — R030 Elevated blood-pressure reading, without diagnosis of hypertension: Secondary | ICD-10-CM | POA: Diagnosis not present

## 2016-08-18 DIAGNOSIS — R0789 Other chest pain: Secondary | ICD-10-CM | POA: Diagnosis not present

## 2016-08-18 DIAGNOSIS — Z8619 Personal history of other infectious and parasitic diseases: Secondary | ICD-10-CM | POA: Diagnosis not present

## 2016-08-18 DIAGNOSIS — R531 Weakness: Secondary | ICD-10-CM | POA: Diagnosis not present

## 2016-08-18 DIAGNOSIS — F449 Dissociative and conversion disorder, unspecified: Secondary | ICD-10-CM | POA: Diagnosis not present

## 2016-08-18 LAB — BASIC METABOLIC PANEL
ANION GAP: 8 (ref 5–15)
BUN: 14 mg/dL (ref 6–20)
CHLORIDE: 106 mmol/L (ref 101–111)
CO2: 25 mmol/L (ref 22–32)
Calcium: 9 mg/dL (ref 8.9–10.3)
Creatinine, Ser: 1.07 mg/dL (ref 0.61–1.24)
Glucose, Bld: 117 mg/dL — ABNORMAL HIGH (ref 65–99)
POTASSIUM: 3.3 mmol/L — AB (ref 3.5–5.1)
SODIUM: 139 mmol/L (ref 135–145)

## 2016-08-18 LAB — TROPONIN I
Troponin I: <0.03 ng/mL (ref <0.03)
Troponin I: <0.03 ng/mL (ref <0.03)

## 2016-08-18 MED ORDER — ONDANSETRON 4 MG PO TBDP
4.0000 mg | ORAL_TABLET | ORAL | Status: DC | PRN
  Administered 2016-08-18: 4 mg via ORAL
  Filled 2016-08-18: qty 1

## 2016-08-18 MED ORDER — POTASSIUM CHLORIDE 20 MEQ/15ML (10%) PO SOLN
40.0000 meq | Freq: Once | ORAL | Status: AC
  Administered 2016-08-18: 40 meq via ORAL
  Filled 2016-08-18: qty 30

## 2016-08-18 MED ORDER — ONDANSETRON HCL 4 MG/2ML IJ SOLN: 4.0000 mg | Freq: Four times a day (QID) | INTRAMUSCULAR | Status: DC | PRN

## 2016-08-18 NOTE — Discharge Instructions (Signed)
Cody Parks,  Workup was reassuring that you did not have a stroke or heart attack causing your symptoms. We do not have a clear reversible cause for your symptoms. Please continue to work with the medical staff at your facility to help regain your strength and alleviate your discomfort.

## 2016-08-18 NOTE — Progress Notes (Signed)
Patient discharged back to correctional facility. Pt updated. Pt transported by correctional facility staff.

## 2016-08-18 NOTE — Evaluation (Signed)
Physical Therapy Evaluation Patient Details Name: Cody Parks MRN: [id number] DOB: Apr 06, 1970 Today's Date: 08/18/2016   History of Present Illness  Pt is a 46 y/o male admitted secondary to stroke like symptoms. MRI, CT negative. Admitted on 6/5 for possible conversion disorder. PMH includes shingles, conversion disorder, and post herpatic neuralgia.   Clinical Impression  PT admitted for problem above with deficits below. Previously admitted for similar episode earlier this month. Pt reports he was independent with mobility PTA. Upon evaluation, pt with many noticeable inconsistencies. Pt demonstrating ability to move L extremities at some points during evaluation, however, not moving L extremities at other points. Pt resisting LLE assist for sit>supine transfer and extending LLE. Pt also falling to the R, however, reports L sided weakness. Pt with poor effort and weakness fluctuated from L to right side throughout. Pt not exhibiting balance reaction strategies to the R and requiring max A for standing balance. Pt reporting concerns of transferring to other prison facility-see general comments. Would need use of WC at this point secondary to high fall risk. Pt currently unsafe during mobility, so recommending HHPT. Will continue to follow acutely to progress mobility as able.     Follow Up Recommendations Home health PT    Equipment Recommendations  None recommended by PT    Recommendations for Other Services       Precautions / Restrictions Precautions Precautions: Fall Precaution Comments: Pt is high fall risk as he does not initiate balance reactions when off his BOS  Restrictions Weight Bearing Restrictions: No      Mobility  Bed Mobility Overal bed mobility: Needs Assistance Bed Mobility: Supine to Sit;Sit to Supine     Supine to sit: Supervision Sit to supine: Min assist   General bed mobility comments: Able to use LUE for trunk elevation during bed mobility. Required  increased time and effort. Min A for sit>supine for LLE management, however, resisting movement by extending LLE into PT assist.   Transfers Overall transfer level: Needs assistance Equipment used: Rolling walker (2 wheeled);2 person hand held assist Transfers: Sit to/from Stand Sit to Stand: Min assist         General transfer comment: Min A for steadying. Unsteady with transfer, pt feeling like he is falling to the R although reporting L sided weakness. Pt pushing heavily to the R in standing. Pt initially unable to grasp walker, however, with LUE assist, able to grasp with LUE.   Ambulation/Gait             General Gait Details: Attempted pre gait marching, however, pt pushing heavily to the R. Unable to march and unsafe to attempt gait secondary to fall risk. Poor effort to maintain standing balance with attempts to fall to R although L sided deficits reported.   Stairs            Wheelchair Mobility    Modified Rankin (Stroke Patients Only)       Balance Overall balance assessment: Needs assistance Sitting-balance support: Feet supported Sitting balance-Leahy Scale: Good     Standing balance support: Bilateral upper extremity supported Standing balance-Leahy Scale: Poor Standing balance comment: Leaning heavily to R, and requiring max A to prevent fall to R. Mad no attempt to correct.                              Pertinent Vitals/Pain Pain Assessment: 0-10 Pain Score: 9  Pain Location: Shoulder blade Pain Descriptors /  Indicators: Discomfort Pain Intervention(s): Limited activity within patient's tolerance;Monitored during session;Repositioned    Home Living Family/patient expects to be discharged to:: [facility]/Prison                      Prior Function Level of Independence: Independent               Hand Dominance   Dominant Hand: Right    Extremity/Trunk Assessment   Upper Extremity Assessment Upper Extremity  Assessment: LUE deficits/detail LUE Deficits / Details: inconsistencise with pt effort regarding ROM and MMT. Pt able to catch LUE when dropped. Able to move LUE at some points, however, also exhibiting no movement at other points in evaluation.     Lower Extremity Assessment Lower Extremity Assessment: LLE deficits/detail RLE Deficits / Details: Falling to the R although reports of L sided weakness.  LLE Deficits / Details: Inconsistent effort during assessment. Able to move LLE when asked to move during MMT, however, did not move during bed mobility. Deficits inconsistent throughout. Pt able to lperform partial SLR in supine with LLE, however, unable to lift leg to get into bed.     Cervical / Trunk Assessment Cervical / Trunk Assessment: Normal  Communication   Communication: No difficulties  Cognition Arousal/Alertness: Awake/alert Behavior During Therapy: Flat affect Overall Cognitive Status: Impaired/Different from baseline Area of Impairment: Safety/judgement                         Safety/Judgement: Decreased awareness of deficits;Decreased awareness of safety     General Comments: Pt with history of conversion disorder. Pt with inconsistencies throughout examination. See below for details.       General Comments General comments (skin integrity, edema, etc.): Pt reporting multiple times that he is not crazy and has deficits. Consoled pt and stated PT was there to help him. Pt stating "No, you don't understand. I'm going to a prison where there are alot of gang bangers and I need to be able to protect myself." Educated pt that we would be there to assist as needed. Encouraged pt to move L extremities as able.     Exercises     Assessment/Plan    PT Assessment Patient needs continued PT services  PT Problem List Decreased activity tolerance;Decreased balance;Decreased mobility;Pain       PT Treatment Interventions Gait training;Functional mobility  training;Therapeutic activities;Therapeutic exercise;Balance training;Neuromuscular re-education    PT Goals (Current goals can be found in the Care Plan section)  Acute Rehab PT Goals Patient Stated Goal: to get better  PT Goal Formulation: With patient Time For Goal Achievement: 08/25/16 Potential to Achieve Goals: Fair    Frequency Min 3X/week   Barriers to discharge        Co-evaluation               AM-PAC PT "6 Clicks" Daily Activity  Outcome Measure Difficulty turning over in bed (including adjusting bedclothes, sheets and blankets)?: A Little Difficulty moving from lying on back to sitting on the side of the bed? : A Lot Difficulty sitting down on and standing up from a chair with arms (e.g., wheelchair, bedside commode, etc,.)?: Total Help needed moving to and from a bed to chair (including a wheelchair)?: A Lot Help needed walking in hospital room?: A Lot Help needed climbing 3-5 steps with a railing? : Total 6 Click Score: 11    End of Session Equipment Utilized During Treatment: Gait belt Activity  Tolerance: Patient tolerated treatment well Patient left: in bed;with call bell/phone within reach (Officers present) Nurse Communication: Mobility status PT Visit Diagnosis: Other symptoms and signs involving the nervous system (R29.898)    Time: [id number] PT Time Calculation (min) (ACUTE ONLY): 21 min   Charges:   PT Evaluation $PT Eval Moderate Complexity: 1 Procedure PT Treatments $Therapeutic Activity: 8-22 mins   PT G Codes:   PT G-Codes **NOT FOR INPATIENT CLASS** Functional Assessment Tool Used: AM-PAC 6 Clicks Basic Mobility;Clinical judgement Functional Limitation: Mobility: Walking and moving around Mobility: Walking and Moving Around Current Status ([id number]): At least 60 percent but less than 80 percent impaired, limited or restricted Mobility: Walking and Moving Around Goal Status [phone]): At least 1 percent but less than 20 percent impaired,  limited or restricted    Gladys DammeBrittany Kenlei Safi, PT, DPT  Acute Rehabilitation Services  Pager: [phone]   Lehman PromBrittany S Rehan Holness 08/18/2016, 12:58 PM

## 2016-08-18 NOTE — Care Management Note (Signed)
Case Management Note  Patient Details  Name: Cody CharityDanny XXXCole MRN: [id number] Date of Birth: 05/27/1970  Subjective/Objective:                    Action/Plan: Dr Allena KatzPatel spoke to physician at [facility] and the prison MD does not feel he needs to return to them. He feels the patient can d/c [facility]. Guards made aware and CM spoke to Mr [name] through the corrections system to arrange transport. Bedside RN updated.   Expected Discharge Date:  08/18/16               Expected Discharge Plan:  Corrections Facility  In-House Referral:     Discharge planning Services  CM Consult  Post Acute Care Choice:    Choice offered to:     DME Arranged:    DME Agency:     HH Arranged:    HH Agency:     Status of Service:  Completed, signed off  If discussed at [employer] Meetings, dates discussed:    Additional Comments:  Kermit BaloKelli F Siboney Requejo, RN 08/18/2016, 4:18 PM

## 2016-08-18 NOTE — Progress Notes (Signed)
   Subjective:  Patient states he does not remember coming to the hospital. He says the last thing he remembers is being in prison then waking up here with an episode of urinary incontinence. He reports bilateral chest wall pain. Reports generalized weakness and balance issues falling to his right side at times.  Objective:  Vital signs in last 24 hours: Vitals:   08/17/16 2028 08/17/16 2147 08/18/16 0020 08/18/16 0548  BP: (!) 149/117 (!) 173/90 99/62 117/80  Pulse: 81 80 69   Resp: (!) 22  18 20   Temp: 98.1 F (36.7 C)  98.1 F (36.7 C) 97.8 F (36.6 C)  TempSrc: Oral  Oral Oral  SpO2: 98%  95% 100%  Weight:       General: resting in bed, no acute distress HEENT: PERRL, EOMI, no scleral icterus, not following consistently on formal eye exam Cardiac: RRR, no rubs, murmurs or gallops Pulm: clear to auscultation bilaterally, moving normal volumes of air Abd: soft, nondistended, BS present Ext: warm and well perfused, no pedal edema Neuro: inconsistent exam with LUE weakness on formal exam, moves his arms freely when distracted, downgoing plantar reflex bilaterally, +2 patellar reflexes   Assessment/Plan:  [name] is a 46 year old male with past medical history of postherpetic neuralgia who presents to the emergency department with complaint of left-sided weakness and then later developed chest pain.  Atypical Chest Pain:  Troponins negative x 3 and EKG w/o acute ischemic changes. CT Chest obtained in the ED yesterday no acute pulmonary abnormality and mild ectasia of the ascending aorta. No findings to suggest this is ACS. This is likely musculoskeletal. - Tylenol when necessary pain - Voltaren gel 4 times a day to chest wall - Gabapentin 400 mg 3 times a day - Protonix 40 mg daily  Left-sided weakness:  CVA ruled out with MRI. He has inconsistent physical exam findings between providers and with repeat examinations. He has no objective findings that point to any specific  etiology for his constellation of symptoms. High suspicion for conversion disorder vs confabulation of symptoms for secondary gain (avoiding prison transfer). He has no obvious acute inpatient medical needs. - Neurology following, appreciate recommendations - Anticipate discharge after PT evaluation  Hypertension: Normotensive now. -Continue amlodipine 10 mg daily  Hypokalemia: K 3.3. Supplement 40 mEq.  Postherpetic neuralgia: -Continue gabapentin 400 mg 3 times a day  Constipation: -Senokot-S daily at bedtime when necessary  Dispo: Anticipated discharge today.  Darreld McleanPatel, Vishal, MD 08/18/2016, 8:50 AM
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
## 2016-08-18 NOTE — Progress Notes (Signed)
SLP Cancellation Note  Patient Details Name: Demetrius CharityDanny XXXCole MRN: [id number] DOB: 1970/03/14   Cancelled treatment:       Reason Eval/Treat Not Completed: SLP screened, no needs identified, will sign off   Ngoc Detjen, Riley NearingBonnie Caroline 08/18/2016, 9:20 AM

## 2016-11-17 ENCOUNTER — Other Ambulatory Visit: Payer: Self-pay

## 2016-11-17 NOTE — Patient Outreach (Signed)
Contact number provided at discharge was incorrect. Unable to obtain mRS = 7

## 2019-06-14 IMAGING — CT CT HEAD CODE STROKE
3 series · 16 of 47 positions shown, 19 images · non-contrast
Comparison: CT head 08/05/2016

CLINICAL DATA: Code stroke.  Left-sided weakness

EXAM:
CT HEAD WITHOUT CONTRAST
TECHNIQUE: Contiguous axial images were obtained from the base of the skull
through the vertex without intravenous contrast.

[Series 3: head 5.0 st · axial · 0.41mm/px · z∈[-135,+15]mm · 10 of 36 slices shown, 13 images]
[im 3/36  brain]
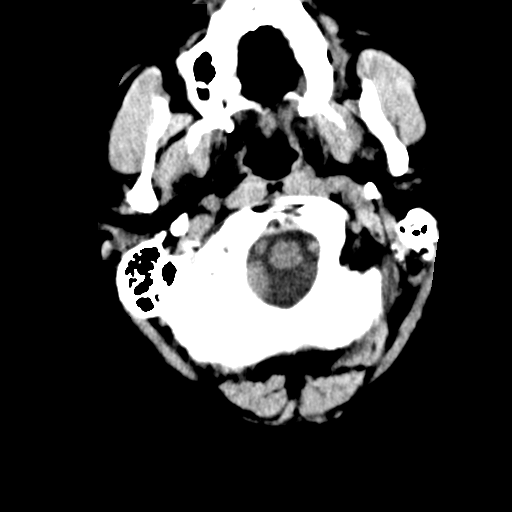
[im 3/36  bone]
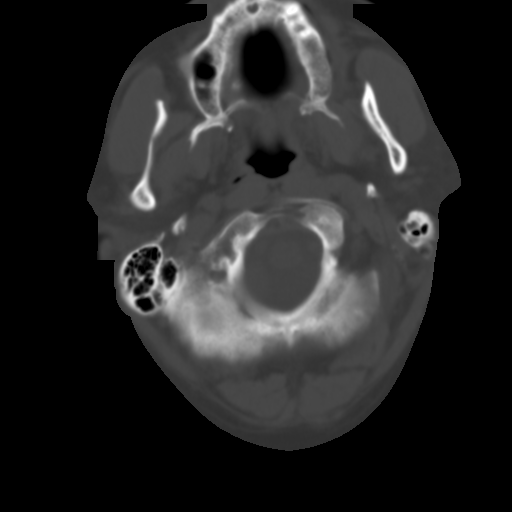
[im 7/36  brain]
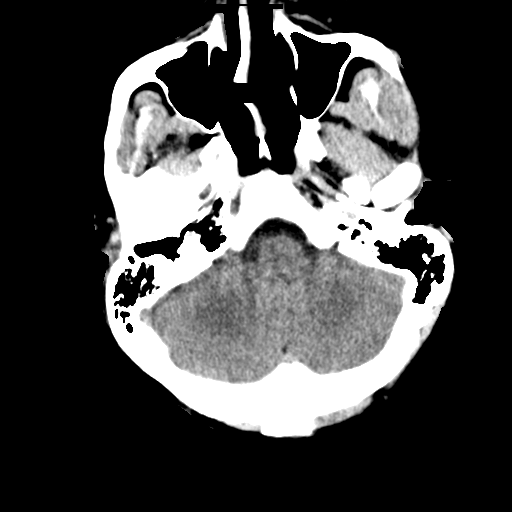
[im 10/36  brain]
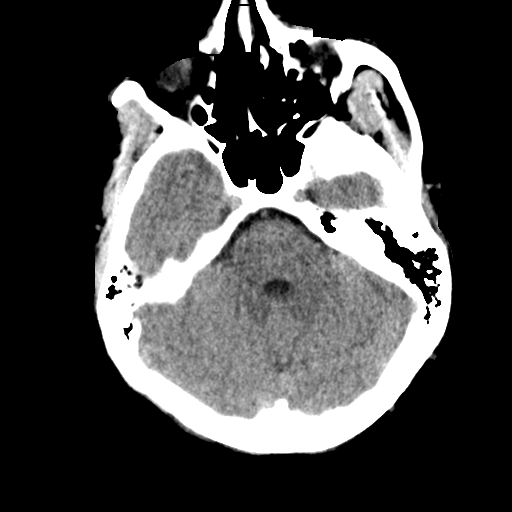
[im 13/36  brain]
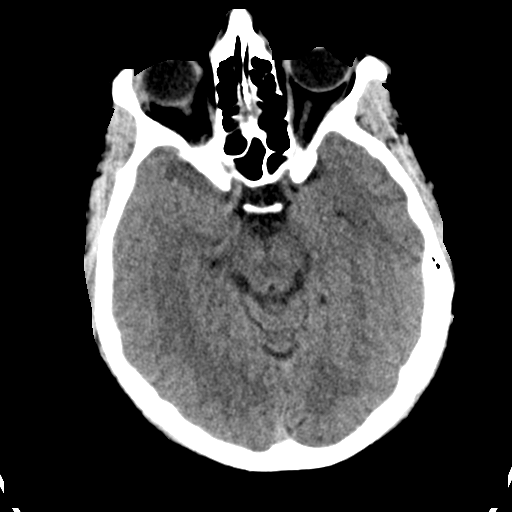
[im 16/36  brain]
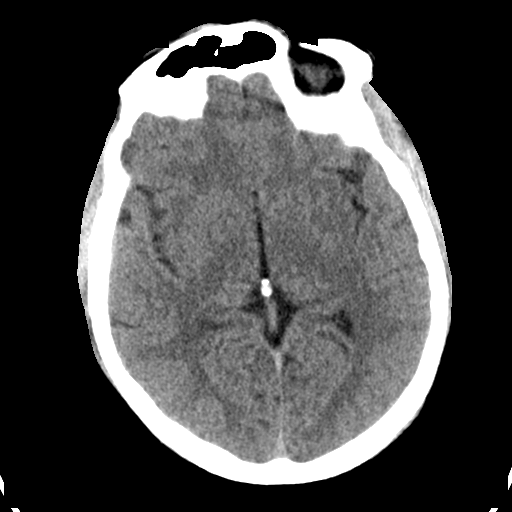
[im 16/36  bone]
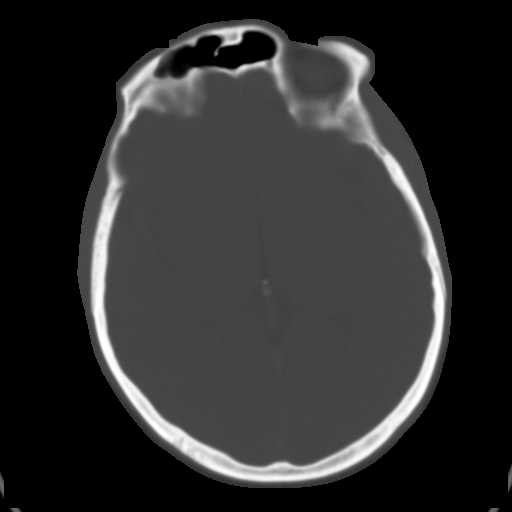
[im 20/36  brain]
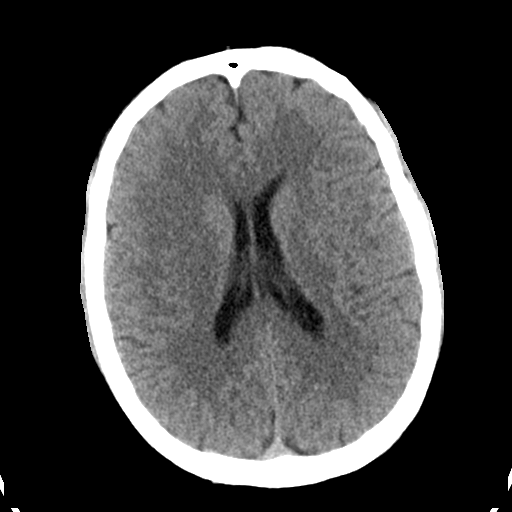
[im 23/36  brain]
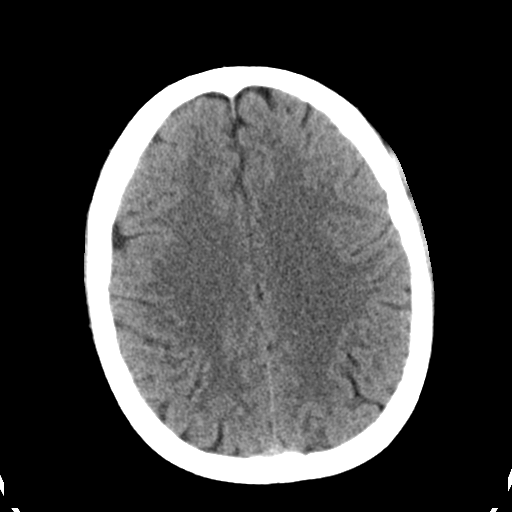
[im 27/36  brain]
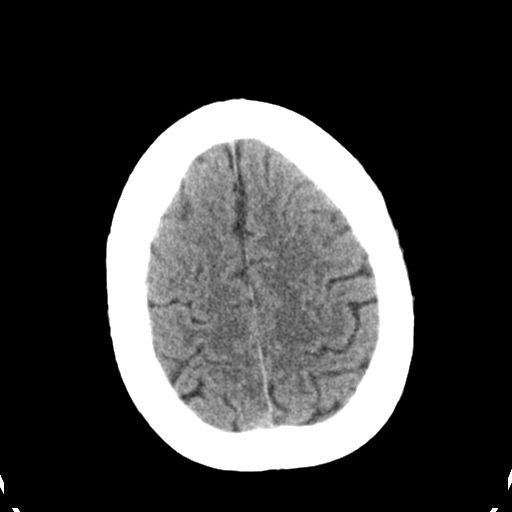
[im 29/36  brain]
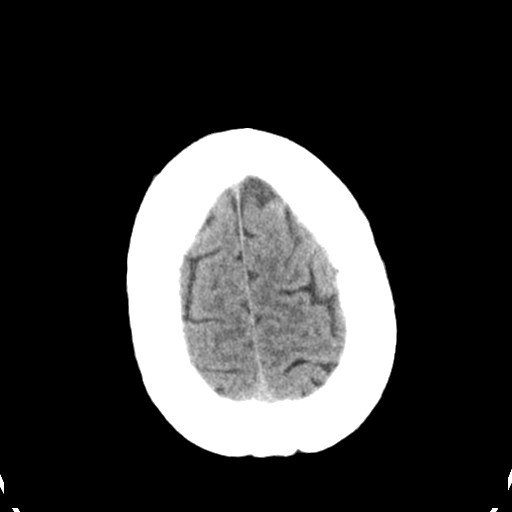
[im 29/36  bone]
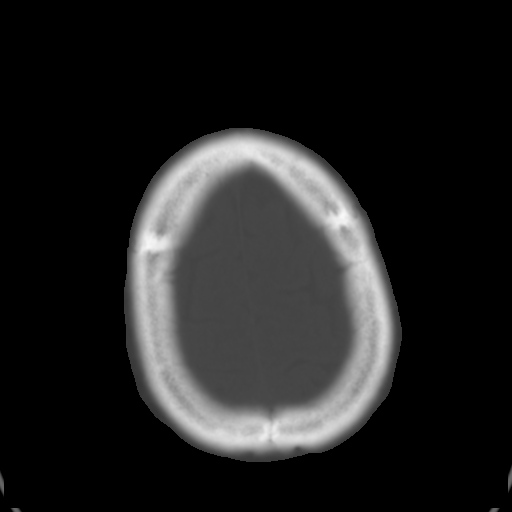
[im 33/36  brain]
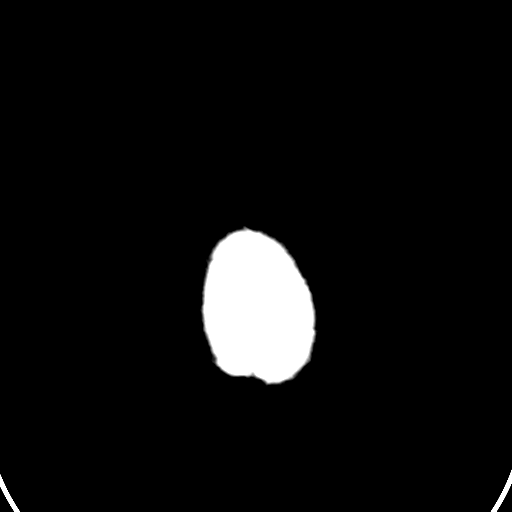

[Series 5: head 3.0 cor st · coronal · 0.33mm/px · 3 of 71 slices shown]
[im 24/71  brain]
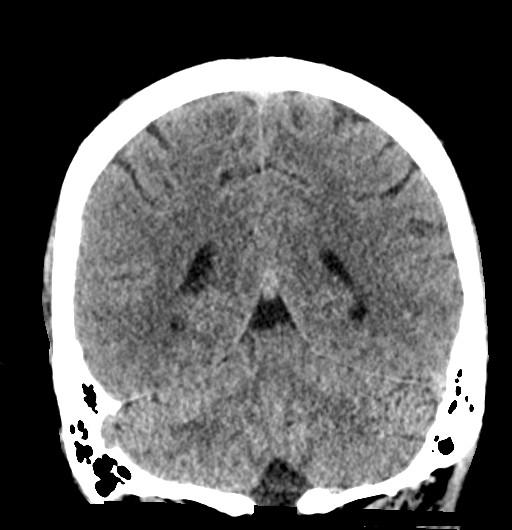
[im 32/71  brain]
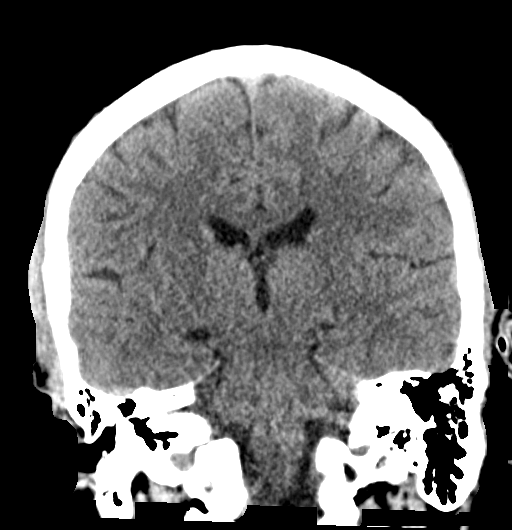
[im 39/71  brain]
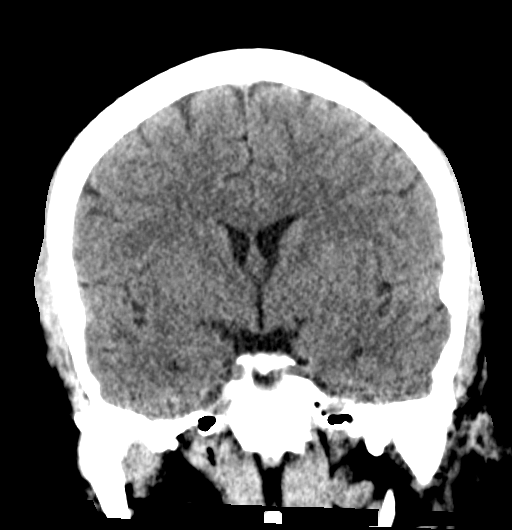

[Series 6: head 3.0 sag st · sagittal · 0.34mm/px · 3 of 67 slices shown]
[im 23/67  brain]
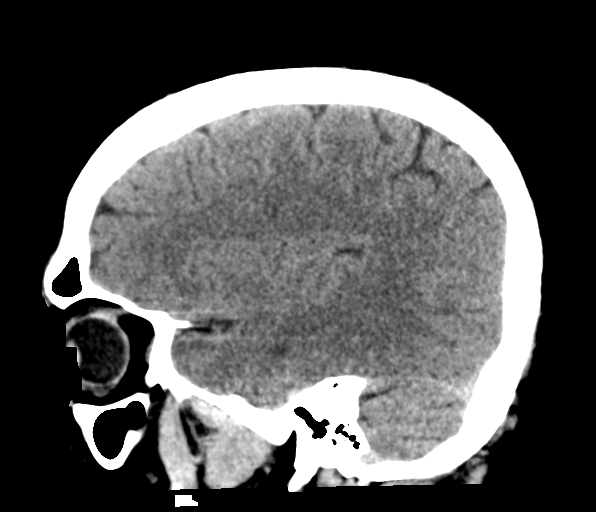
[im 34/67  brain]
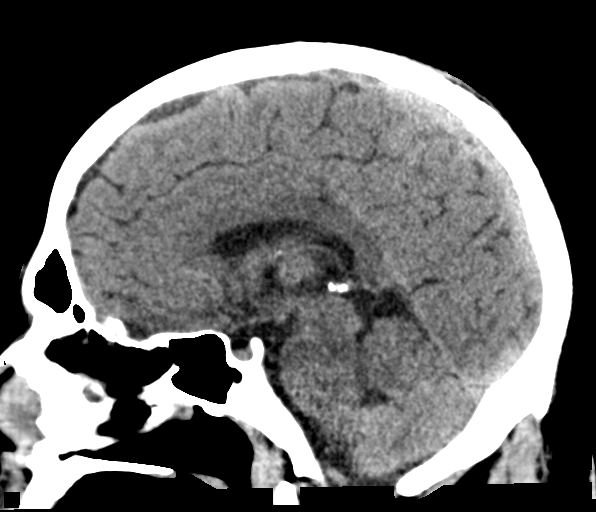
[im 45/67  brain]
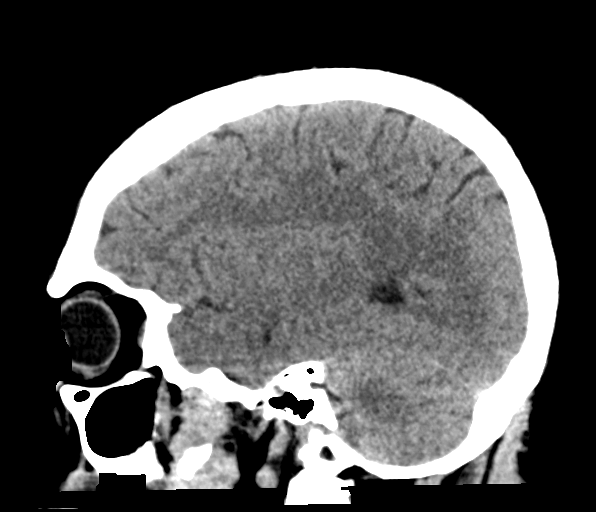

[16 of 47 positions shown; findings below may reference images not displayed]

FINDINGS: Brain: No evidence of acute infarction, hemorrhage, hydrocephalus,
extra-axial collection or mass lesion/mass effect.

Vascular: Negative for hyperdense vessel

Skull: Negative

Sinuses/Orbits: Negative

Other: None

ASPECTS (Alberta Stroke Program Early CT Score)

- Ganglionic level infarction (caudate, lentiform nuclei, internal
capsule, insula, M1-M3 cortex): 7

- Supraganglionic infarction (M4-M6 cortex): 3

Total score (0-10 with 10 being normal): 10
IMPRESSION: 1. Negative CT head
2. ASPECTS is 10

These results were called by telephone at the time of interpretation
on 08/17/2016 at [DATE] to Dr. Klecio, who verbally acknowledged
these results.

## 2019-06-14 IMAGING — MR MR HEAD W/O CM
4 of 5 series · 30 of 48 positions shown · non-contrast
Comparison: Head CT 08/17/2016, MRI 08/05/2016, and MRA 08/04/2016

CLINICAL DATA: Acute left upper and lower extremity weakness with
headache and slurred speech.

EXAM:
MRI HEAD WITHOUT CONTRAST
MRA HEAD WITHOUT CONTRAST
TECHNIQUE: Multiplanar, multiecho pulse sequences of the brain and surrounding
structures were obtained without intravenous contrast. Angiographic
images of the head were obtained using MRA technique without
contrast.

[Series 3: DWI · axial · 3.0mm · 1.09mm/px · z∈[-84,+54]mm · 8 of 94 slices shown (1 of 2)]
[im 1/94]
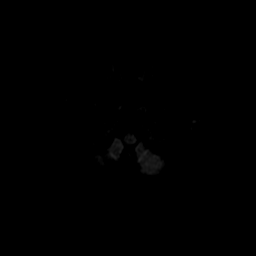
[im 15/94]
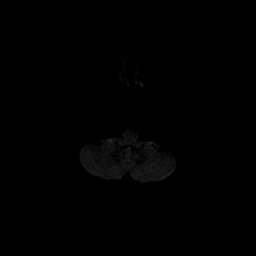
[im 29/94]
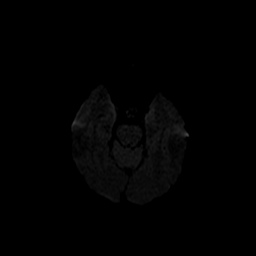
[im 43/94]
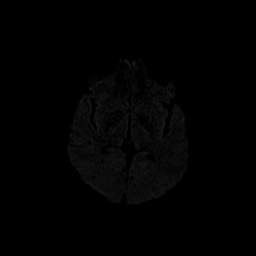
[im 51/94]
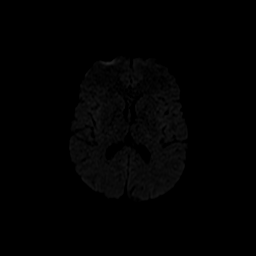
[im 65/94]
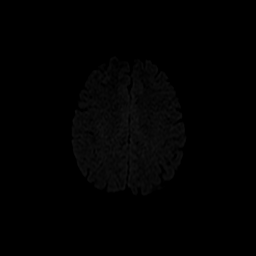
[im 79/94]
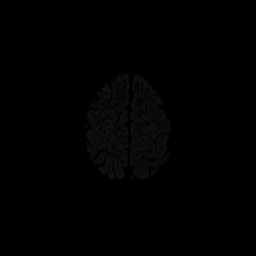
[im 94/94]
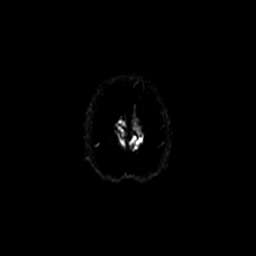

[Series 4: FLAIR · axial · 5.0mm · 0.47mm/px · z∈[-83,+57]mm · 3 of 21 slices shown]
[im 1/21]
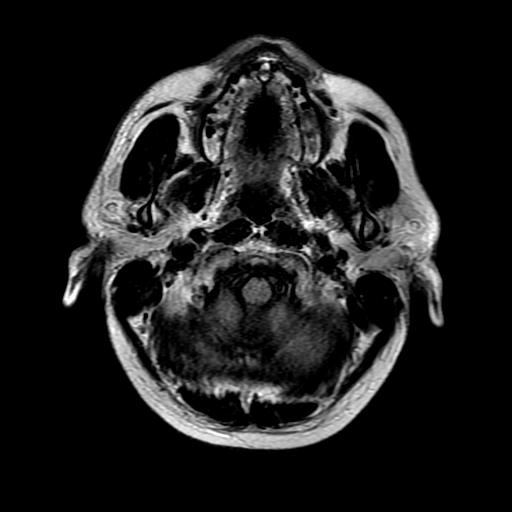
[im 11/21]
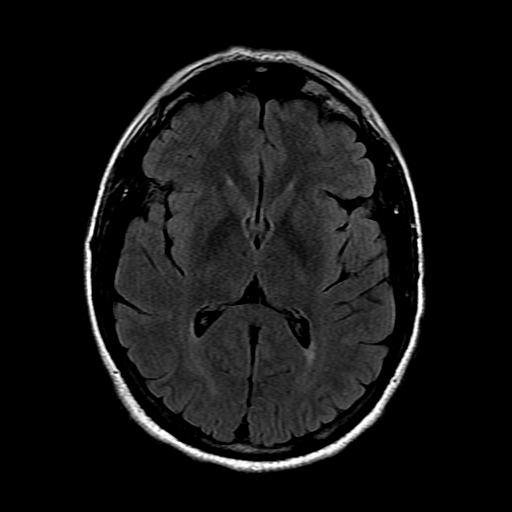
[im 21/21]
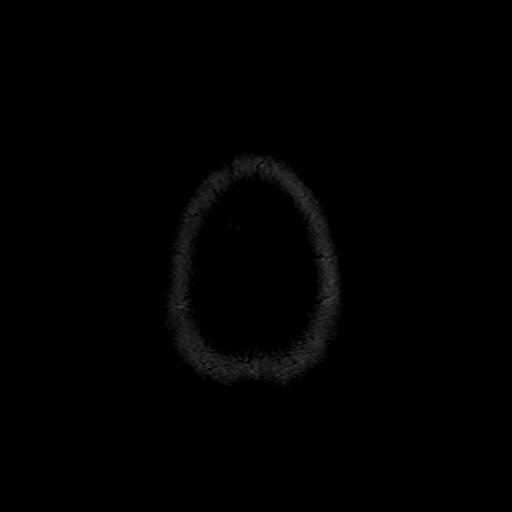

[Series 6: (id) mt fs · axial · 1.4mm · 0.43mm/px · z∈[-70,+15]mm · 11 of 136 slices shown]
[im 7/136]
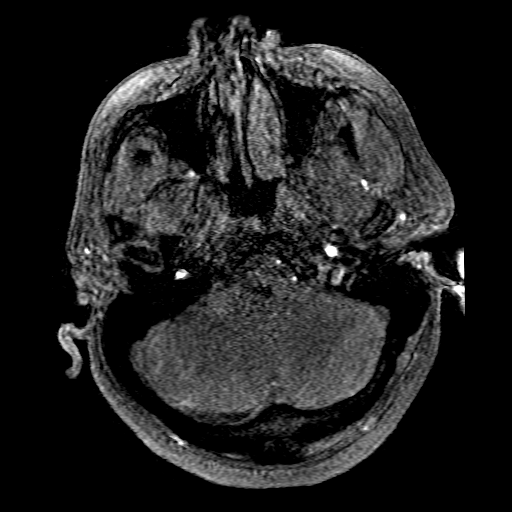
[im 20/136]
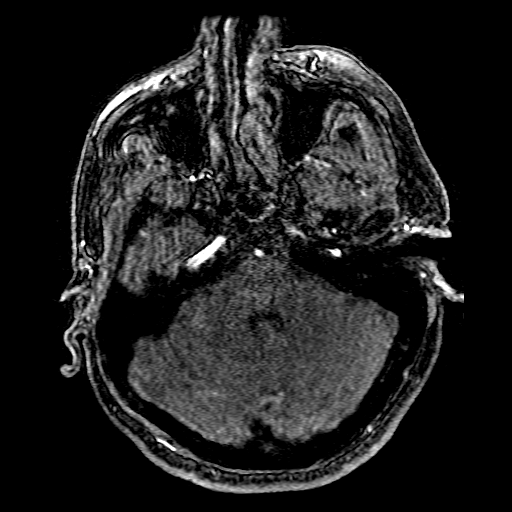
[im 26/136]
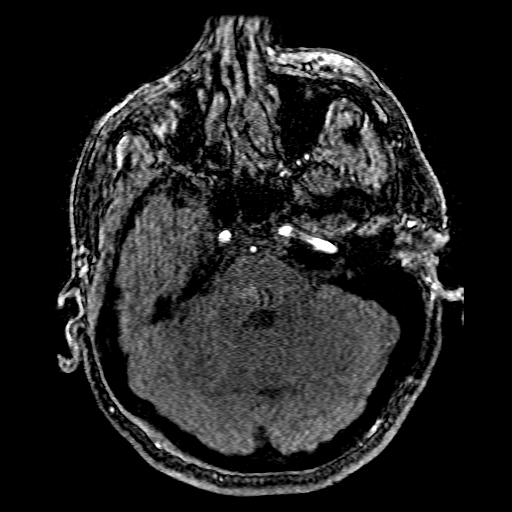
[im 39/136]
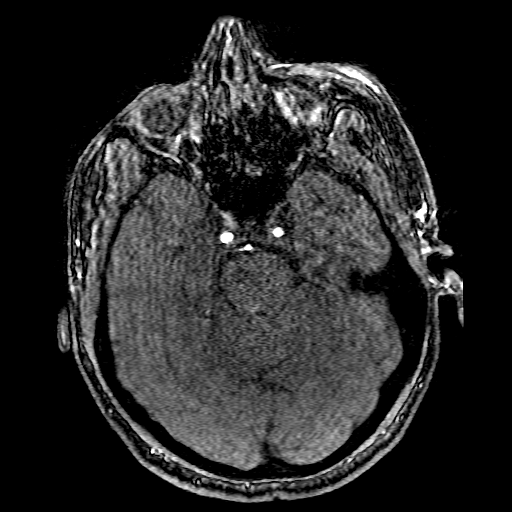
[im 58/136]
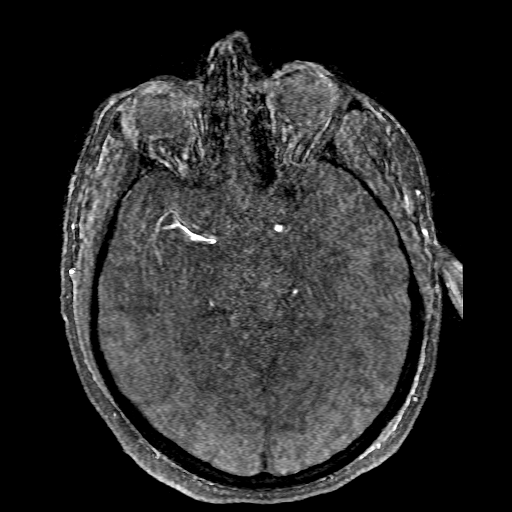
[im 71/136]
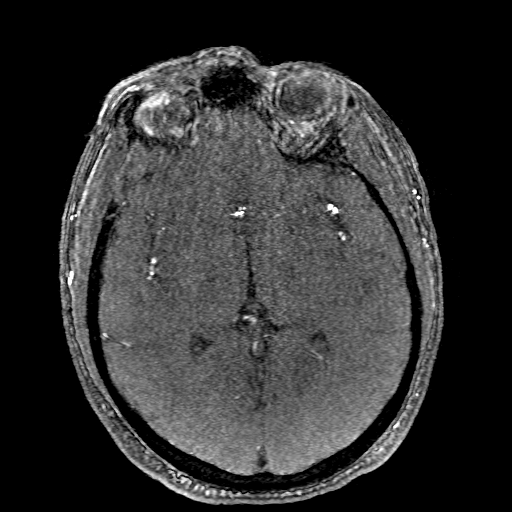
[im 78/136]
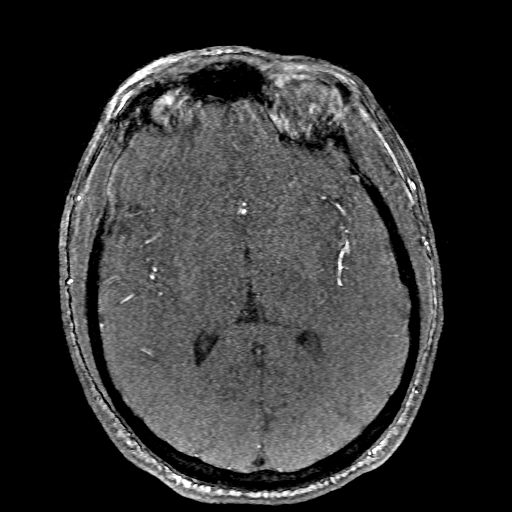
[im 97/136]
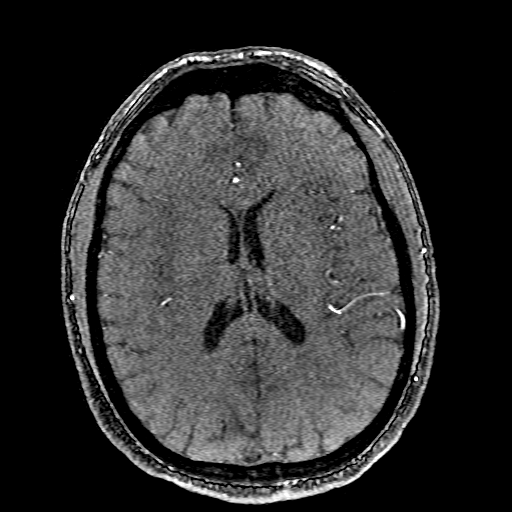
[im 110/136]
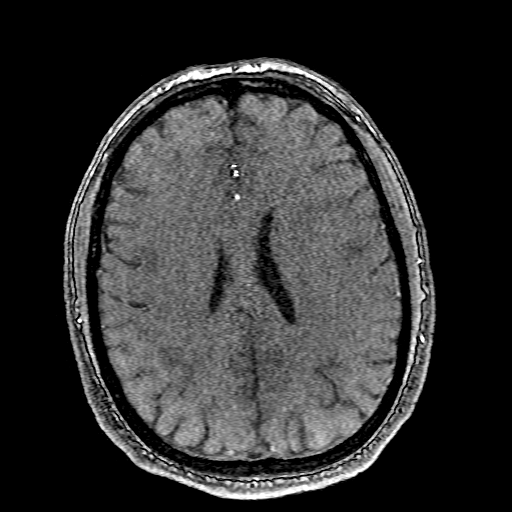
[im 116/136]
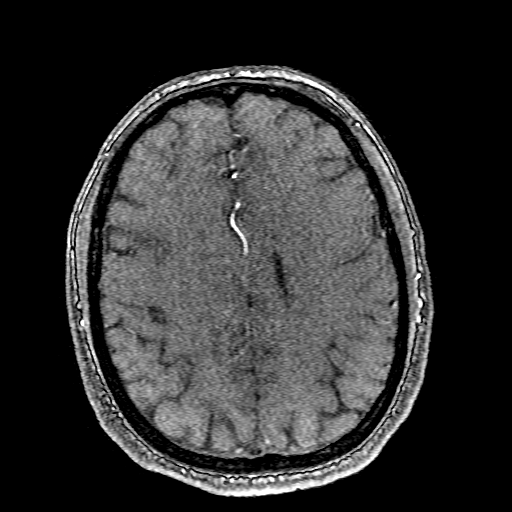
[im 129/136]
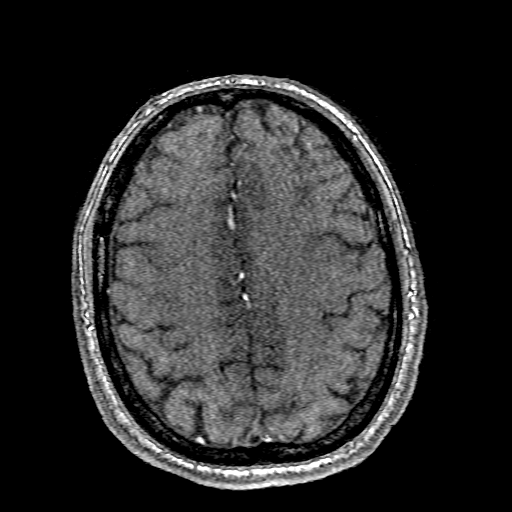

[Series 300: DWI · axial · 3.0mm · 1.09mm/px · z∈[-84,+54]mm · 8 of 47 slices shown (2 of 2)]
[im 1/47]
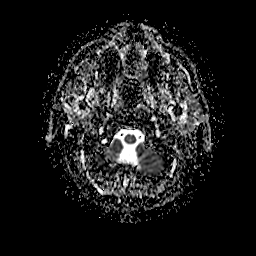
[im 7/47]
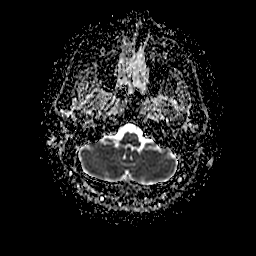
[im 14/47]
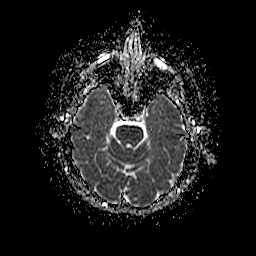
[im 20/47]
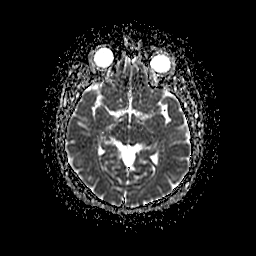
[im 27/47]
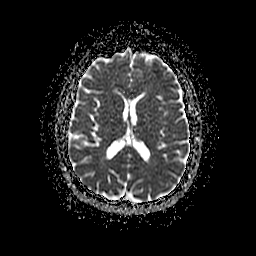
[im 33/47]
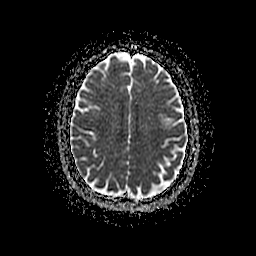
[im 40/47]
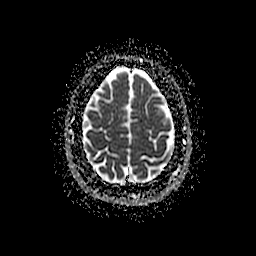
[im 47/47]
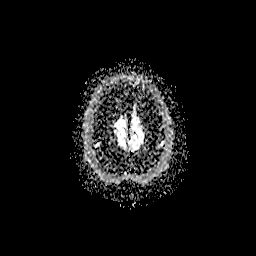

[30 of 48 positions shown; findings below may reference images not displayed]

FINDINGS: MRI HEAD FINDINGS

Only axial diffusion and axial FLAIR sequences were obtained at the
direction of the stroke neurologist.

There is no restricted diffusion to indicate acute infarct. No
intracranial hemorrhage, mass, midline shift, or extra-axial fluid
collection is seen on this limited examination. The ventricles and
sulci are normal in size. No significant cerebral white matter
disease is seen.

MRA HEAD FINDINGS

The study is moderately motion degraded.

Only the distal most aspects of the V4 segments were imaged, both of
which demonstrate antegrade flow. The basilar artery is patent
without evidence of significant stenosis. There is gross flow in the
proximal superior cerebellar arteries. There are right larger than
left posterior communicating arteries. Focal areas of near complete
signal dropout in the left greater than right P1 segments on the
reformatted images is favored to be artifactual due to motion given
patency on the recent prior MRA which demonstrated only at most mild
narrowing.

The internal carotid arteries are patent from skullbase to carotid
termini without evidence of significant stenosis. ACAs and MCAs are
patent without evidence of proximal branch occlusion. Assessment for
stenosis is limited by motion artifact. No intracranial aneurysm is
identified.
IMPRESSION: 1. Limited brain MRI without evidence of acute infarct.
2. Motion degraded MRA without large vessel occlusion.
